# Patient Record
Sex: Male | Born: 1950 | Race: White | Hispanic: No | Marital: Married | Smoking: Former smoker
Health system: Southern US, Community
[De-identification: ages and names within clinical notes are randomized; demographics above are authoritative.]

## PROBLEM LIST (undated history)

## (undated) DIAGNOSIS — C44211 Basal cell carcinoma of skin of unspecified ear and external auricular canal: Secondary | ICD-10-CM

## (undated) DIAGNOSIS — R030 Elevated blood-pressure reading, without diagnosis of hypertension: Secondary | ICD-10-CM

## (undated) DIAGNOSIS — N200 Calculus of kidney: Secondary | ICD-10-CM

## (undated) DIAGNOSIS — E291 Testicular hypofunction: Secondary | ICD-10-CM

## (undated) DIAGNOSIS — H16139 Photokeratitis, unspecified eye: Secondary | ICD-10-CM

## (undated) DIAGNOSIS — D649 Anemia, unspecified: Secondary | ICD-10-CM

## (undated) HISTORY — DX: Basal cell carcinoma of skin of unspecified ear and external auricular canal: C44.211

## (undated) HISTORY — DX: Testicular hypofunction: E29.1

## (undated) HISTORY — DX: Photokeratitis, unspecified eye: H16.139

## (undated) HISTORY — DX: Calculus of kidney: N20.0

## (undated) HISTORY — DX: Elevated blood-pressure reading, without diagnosis of hypertension: R03.0

## (undated) HISTORY — DX: Anemia, unspecified: D64.9

---

## 1995-06-21 HISTORY — PX: WRIST SURGERY: SHX841

## 1995-06-21 HISTORY — PX: SHOULDER SURGERY: SHX246

## 1998-10-18 ENCOUNTER — Encounter: Admission: RE | Admit: 1998-10-18 | Discharge: 1998-10-18 | Payer: Self-pay | Admitting: *Deleted

## 2004-05-25 ENCOUNTER — Emergency Department (HOSPITAL_COMMUNITY): Admission: EM | Admit: 2004-05-25 | Discharge: 2004-05-25 | Payer: Self-pay | Admitting: Emergency Medicine

## 2004-06-20 DIAGNOSIS — N200 Calculus of kidney: Secondary | ICD-10-CM

## 2004-06-20 HISTORY — DX: Calculus of kidney: N20.0

## 2006-05-29 ENCOUNTER — Ambulatory Visit (HOSPITAL_COMMUNITY): Admission: RE | Admit: 2006-05-29 | Discharge: 2006-05-29 | Payer: Self-pay | Admitting: Internal Medicine

## 2012-06-20 HISTORY — PX: MOHS SURGERY: SUR867

## 2012-08-16 ENCOUNTER — Ambulatory Visit (HOSPITAL_COMMUNITY)
Admission: RE | Admit: 2012-08-16 | Discharge: 2012-08-16 | Disposition: A | Discharge status: Home / Self Care | Payer: BC Managed Care – PPO | Source: Ambulatory Visit | Attending: Internal Medicine | Admitting: Internal Medicine

## 2012-08-16 ENCOUNTER — Other Ambulatory Visit (HOSPITAL_COMMUNITY): Payer: Self-pay | Admitting: Internal Medicine

## 2012-08-16 DIAGNOSIS — I10 Essential (primary) hypertension: Secondary | ICD-10-CM | POA: Insufficient documentation

## 2012-08-16 DIAGNOSIS — Z0000 Encounter for general adult medical examination without abnormal findings: Secondary | ICD-10-CM | POA: Insufficient documentation

## 2012-08-16 DIAGNOSIS — F17200 Nicotine dependence, unspecified, uncomplicated: Secondary | ICD-10-CM | POA: Insufficient documentation

## 2012-08-20 ENCOUNTER — Encounter: Payer: Self-pay | Admitting: Internal Medicine

## 2012-09-12 ENCOUNTER — Encounter: Payer: BC Managed Care – PPO | Admitting: Gastroenterology

## 2013-01-01 ENCOUNTER — Encounter: Payer: Self-pay | Admitting: Gastroenterology

## 2013-07-15 ENCOUNTER — Encounter: Payer: Self-pay | Admitting: *Deleted

## 2013-07-17 ENCOUNTER — Encounter: Payer: Self-pay | Admitting: Emergency Medicine

## 2013-07-17 ENCOUNTER — Ambulatory Visit (INDEPENDENT_AMBULATORY_CARE_PROVIDER_SITE_OTHER): Payer: BC Managed Care – PPO | Admitting: Emergency Medicine

## 2013-07-17 VITALS — BP 138/82 | HR 74 | Temp 98.2°F | Resp 16 | Ht 70.5 in | Wt 200.0 lb

## 2013-07-17 DIAGNOSIS — N200 Calculus of kidney: Secondary | ICD-10-CM

## 2013-07-17 DIAGNOSIS — R030 Elevated blood-pressure reading, without diagnosis of hypertension: Secondary | ICD-10-CM

## 2013-07-17 DIAGNOSIS — Z111 Encounter for screening for respiratory tuberculosis: Secondary | ICD-10-CM

## 2013-07-17 DIAGNOSIS — Z0000 Encounter for general adult medical examination without abnormal findings: Secondary | ICD-10-CM

## 2013-07-17 DIAGNOSIS — Z79899 Other long term (current) drug therapy: Secondary | ICD-10-CM

## 2013-07-17 DIAGNOSIS — I10 Essential (primary) hypertension: Secondary | ICD-10-CM

## 2013-07-17 DIAGNOSIS — C44211 Basal cell carcinoma of skin of unspecified ear and external auricular canal: Secondary | ICD-10-CM

## 2013-07-17 DIAGNOSIS — E559 Vitamin D deficiency, unspecified: Secondary | ICD-10-CM

## 2013-07-17 LAB — CBC WITH DIFFERENTIAL/PLATELET
Basophils Absolute: 0.1 10*3/uL (ref 0.0–0.1)
Basophils Relative: 1 % (ref 0–1)
EOS PCT: 2 % (ref 0–5)
Eosinophils Absolute: 0.2 10*3/uL (ref 0.0–0.7)
HCT: 45.1 % (ref 39.0–52.0)
Hemoglobin: 15.8 g/dL (ref 13.0–17.0)
LYMPHS ABS: 3.5 10*3/uL (ref 0.7–4.0)
Lymphocytes Relative: 34 % (ref 12–46)
MCH: 28.7 pg (ref 26.0–34.0)
MCHC: 35 g/dL (ref 30.0–36.0)
MCV: 81.9 fL (ref 78.0–100.0)
Monocytes Absolute: 0.8 10*3/uL (ref 0.1–1.0)
Monocytes Relative: 8 % (ref 3–12)
Neutro Abs: 5.6 10*3/uL (ref 1.7–7.7)
Neutrophils Relative %: 55 % (ref 43–77)
Platelets: 268 10*3/uL (ref 150–400)
RBC: 5.51 MIL/uL (ref 4.22–5.81)
RDW: 13.9 % (ref 11.5–15.5)
WBC: 10.1 10*3/uL (ref 4.0–10.5)

## 2013-07-17 NOTE — Progress Notes (Signed)
   Subjective:    Patient ID: Adam Hammond, male    DOB: 1951/02/07, 63 y.o.   MRN: [id number]  HPI Comments: 64 yo WM CPE. He notes he is doing well overall without any concerns. He has a hx of elevated BP without HTN or RX therapy. BP at home 120s-30s/ 80s. He has d/c pie/cookies/cakes. He eats ice cream on routine basis. He is active with work only. LAST LABS A1C 5.5 T 141 TG 114 H 27 L 91 URIC ACID 5.5  D24 TRACE BLOOD IN urine which  He did not f/u on AD. He denies any urine changes or obvious blood.  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 He denies any SOB or cough.  He keeps his 7m to 1 year [facility] with [place]. He denies any new skin changes.      Review of Systems  Skin:       [place]- due 11/2013  All other systems reviewed and are negative.   BP 138/82  Pulse 74  Temp(Src) 98.2 F (36.8 C) (Temporal)  Resp 16  Ht 5' 10.5" (1.791 m)  Wt 200 lb (90.719 kg)  BMI 28.28 kg/m2     Objective:   Physical Exam  Nursing note and vitals reviewed. Constitutional: He is oriented to person, place, and time. He appears well-developed and well-nourished.  OVERWEIGHT  HENT:  Head: Normocephalic and atraumatic.  Right Ear: External ear normal.  Left Ear: External ear normal.  Nose: Nose normal.  Eyes: Conjunctivae and EOM are normal. Pupils are equal, round, and reactive to light. Right eye exhibits no discharge. Left eye exhibits no discharge. No scleral icterus.  Neck: Normal range of motion. Neck supple. No JVD present. No tracheal deviation present. No thyromegaly present.  Cardiovascular: Normal rate, regular rhythm, normal heart sounds and intact distal pulses.   Pulmonary/Chest: Effort normal and breath sounds normal.  Abdominal: Soft. Bowel sounds are normal. He exhibits no distension and no mass. There is no tenderness. There is no rebound and no guarding.  Genitourinary:  Def GI  Musculoskeletal: Normal range of motion. He  exhibits no edema and no tenderness.  Lymphadenopathy:    He has no cervical adenopathy.  Neurological: He is alert and oriented to person, place, and time. He has normal reflexes. No cranial nerve deficit. He exhibits normal muscle tone. Coordination normal.  Skin: Skin is warm and dry. No rash noted. No erythema. No pallor.     Psychiatric: He has a normal mood and affect. His behavior is normal. Judgment and thought content normal.      EKG NSCSPT WNL IRBBB AORTA SCAN WNL    Assessment & Plan:  1. CPE- Update screening labs/ History/ Immunizations/ Testing as needed. Advised healthy diet, QD exercise, increase H20 and continue RX/ Vitamins AD.PATIENT W/C TO RESCHEDULE OVERDUE COLONOSCOPY. 2. Elevated BP without HTN- Check BP call if >130/80, increase cardio 3. Irreg nevi left shoulder blade- monitor w/c if any change/ recheck at [facility] due 6 month

## 2013-07-17 NOTE — Patient Instructions (Signed)
Hypertension Hypertension is another name for high blood pressure. High blood pressure may mean that your heart needs to work harder to pump blood. Blood pressure consists of two numbers, which includes a higher number over a lower number (example: 110/72). HOME CARE   Make lifestyle changes as told by your doctor. This may include weight loss and exercise.  Take your blood pressure medicine every day.  Limit how much salt you use.  Stop smoking if you smoke.  Do not use drugs.  Talk to your doctor if you are using decongestants or birth control pills. These medicines might make blood pressure higher.  Females should not drink more than 1 alcoholic drink per day. Males should not drink more than 2 alcoholic drinks per day.  See your doctor as told. GET HELP RIGHT AWAY IF:   You have a blood pressure reading with a top number of 180 or higher.  You get a very bad headache.  You get blurred or changing vision.  You feel confused.  You feel weak, numb, or faint.  You get chest or belly (abdominal) pain.  You throw up (vomit).  You cannot breathe very well. MAKE SURE YOU:   Understand these instructions.  Will watch your condition.  Will get help right away if you are not doing well or get worse. Document Released: 11/23/2007 Document Revised: 08/29/2011 Document Reviewed: 11/23/2007 [employer] Patient Information 2014 [employer].  

## 2013-07-18 LAB — BASIC METABOLIC PANEL WITH GFR
BUN: 13 mg/dL (ref 6–23)
CO2: 25 mEq/L (ref 19–32)
Calcium: 9.2 mg/dL (ref 8.4–10.5)
Chloride: 102 mEq/L (ref 96–112)
Creat: 1.12 mg/dL (ref 0.50–1.35)
GFR, Est African American: 82 mL/min
GFR, Est Non African American: 71 mL/min
Glucose, Bld: 85 mg/dL (ref 70–99)
Potassium: 4.2 mEq/L (ref 3.5–5.3)
Sodium: 135 mEq/L (ref 135–145)

## 2013-07-18 LAB — URINALYSIS, ROUTINE W REFLEX MICROSCOPIC
Bilirubin Urine: NEGATIVE
Glucose, UA: NEGATIVE mg/dL
Hgb urine dipstick: NEGATIVE
Ketones, ur: NEGATIVE mg/dL
Leukocytes, UA: NEGATIVE
NITRITE: NEGATIVE
Protein, ur: NEGATIVE mg/dL
Specific Gravity, Urine: 1.019 (ref 1.005–1.030)
Urobilinogen, UA: 0.2 mg/dL (ref 0.0–1.0)
pH: 5.5 (ref 5.0–8.0)

## 2013-07-18 LAB — MICROALBUMIN / CREATININE URINE RATIO
CREATININE, URINE: 196.7 mg/dL
Microalb Creat Ratio: 2.9 mg/g (ref 0.0–30.0)
Microalb, Ur: 0.58 mg/dL (ref 0.00–1.89)

## 2013-07-18 LAB — HEPATIC FUNCTION PANEL
ALT: 28 U/L (ref 0–53)
AST: 22 U/L (ref 0–37)
Albumin: 4.5 g/dL (ref 3.5–5.2)
Alkaline Phosphatase: 76 U/L (ref 39–117)
BILIRUBIN DIRECT: 0.1 mg/dL (ref 0.0–0.3)
Indirect Bilirubin: 0.4 mg/dL (ref 0.2–1.2)
Total Bilirubin: 0.5 mg/dL (ref 0.2–1.2)
Total Protein: 6.7 g/dL (ref 6.0–8.3)

## 2013-07-18 LAB — INSULIN, FASTING: Insulin fasting, serum: 18 u[IU]/mL (ref 3–28)

## 2013-07-18 LAB — LIPID PANEL
Cholesterol: 148 mg/dL (ref 0–200)
HDL: 30 mg/dL — ABNORMAL LOW (ref >39)
LDL CALC: 90 mg/dL (ref 0–99)
Total CHOL/HDL Ratio: 4.9 Ratio
Triglycerides: 140 mg/dL (ref <150)
VLDL: 28 mg/dL (ref 0–40)

## 2013-07-18 LAB — HEMOGLOBIN A1C
Hgb A1c MFr Bld: 5.5 % (ref <5.7)
Mean Plasma Glucose: 111 mg/dL (ref <117)

## 2013-07-18 LAB — TSH: TSH: 0.824 u[IU]/mL (ref 0.350–4.500)

## 2013-07-18 LAB — PSA: PSA: 0.83 ng/mL (ref <=4.00)

## 2013-07-18 LAB — TESTOSTERONE: Testosterone: 196 ng/dL — ABNORMAL LOW (ref 300–890)

## 2013-07-18 LAB — VITAMIN D 25 HYDROXY (VIT D DEFICIENCY, FRACTURES): Vit D, 25-Hydroxy: 28 ng/mL — ABNORMAL LOW (ref 30–89)

## 2013-07-18 LAB — MAGNESIUM: Magnesium: 2.1 mg/dL (ref 1.5–2.5)

## 2013-07-18 LAB — VITAMIN B12: Vitamin B-12: 292 pg/mL (ref 211–911)

## 2013-07-22 ENCOUNTER — Encounter: Payer: Self-pay | Admitting: Emergency Medicine

## 2013-07-22 DIAGNOSIS — C44211 Basal cell carcinoma of skin of unspecified ear and external auricular canal: Secondary | ICD-10-CM | POA: Insufficient documentation

## 2013-07-22 DIAGNOSIS — R030 Elevated blood-pressure reading, without diagnosis of hypertension: Secondary | ICD-10-CM | POA: Insufficient documentation

## 2013-07-22 DIAGNOSIS — N200 Calculus of kidney: Secondary | ICD-10-CM | POA: Insufficient documentation

## 2013-07-22 LAB — TB SKIN TEST
Induration: 0 mm
TB Skin Test: NEGATIVE

## 2013-07-24 NOTE — Progress Notes (Signed)
[facility] TO CALL TO SCHEDULE A 6 WK APPT

## 2013-09-05 ENCOUNTER — Encounter: Payer: Self-pay | Admitting: Emergency Medicine

## 2013-09-05 ENCOUNTER — Ambulatory Visit (INDEPENDENT_AMBULATORY_CARE_PROVIDER_SITE_OTHER): Payer: BC Managed Care – PPO | Admitting: Emergency Medicine

## 2013-09-05 VITALS — BP 122/68 | HR 80 | Temp 98.0°F | Resp 18 | Ht 71.5 in | Wt 195.0 lb

## 2013-09-05 DIAGNOSIS — E559 Vitamin D deficiency, unspecified: Secondary | ICD-10-CM

## 2013-09-05 DIAGNOSIS — E291 Testicular hypofunction: Secondary | ICD-10-CM

## 2013-09-05 DIAGNOSIS — E538 Deficiency of other specified B group vitamins: Secondary | ICD-10-CM

## 2013-09-05 DIAGNOSIS — I10 Essential (primary) hypertension: Secondary | ICD-10-CM

## 2013-09-05 LAB — CBC WITH DIFFERENTIAL/PLATELET
Basophils Absolute: 0.1 10*3/uL (ref 0.0–0.1)
Basophils Relative: 1 % (ref 0–1)
Eosinophils Absolute: 0.4 10*3/uL (ref 0.0–0.7)
Eosinophils Relative: 4 % (ref 0–5)
HCT: 45.1 % (ref 39.0–52.0)
Hemoglobin: 16 g/dL (ref 13.0–17.0)
Lymphocytes Relative: 33 % (ref 12–46)
Lymphs Abs: 3 10*3/uL (ref 0.7–4.0)
MCH: 28.6 pg (ref 26.0–34.0)
MCHC: 35.5 g/dL (ref 30.0–36.0)
MCV: 80.7 fL (ref 78.0–100.0)
Monocytes Absolute: 1 10*3/uL (ref 0.1–1.0)
Monocytes Relative: 11 % (ref 3–12)
Neutro Abs: 4.6 10*3/uL (ref 1.7–7.7)
Neutrophils Relative %: 51 % (ref 43–77)
Platelets: 280 10*3/uL (ref 150–400)
RBC: 5.59 MIL/uL (ref 4.22–5.81)
RDW: 13.7 % (ref 11.5–15.5)
WBC: 9.1 10*3/uL (ref 4.0–10.5)

## 2013-09-05 NOTE — Patient Instructions (Signed)
Vitamin B12 Deficiency ADD SUPER B COMPLEX Not having enough vitamin B12 is called a deficiency. Your body needs this vitamin for important body functions. HOME CARE  Take all vitamins, herbs, or nutrition drinks (supplements) as told by your doctor.  Get shots (injections) as told. Do not miss your doctor visit.  Eat foods than contain vitamin B12. This includes:  Meat.  Chicken, [place], or other birds (poultry).  Fish.  Eggs.  Cereals or milk with added vitamin B12. Check the label.  Do not drink too much (abuse) alcohol.  Keep all doctor visits as told. GET HELP IF:  You have questions.  Your problems come back. MAKE SURE YOU:  Understand these instructions.  Will watch your condition.  Will get help right away if you are not doing well or get worse. Document Released: 05/26/2011 Document Revised: 08/29/2011 Document Reviewed: 05/26/2011 [facility] Patient Information 2014 [place], [place].

## 2013-09-05 NOTE — Progress Notes (Signed)
   Subjective:    Patient ID: Adam Hammond, male    DOB: 03/18/1951, 63 y.o.   MRN: [id number]  HPI Comments: 63 yo male for anemia/ Vit d. Def, hypogonadism f/u. He added almonds and Vit D at last OV. He did not add B12 AD.  He did not start testosterone. He prefers to stay off all Rx if possible. He is feeling well overall.     Medication List    Notice As of 09/05/2013 11:59 PM   You have not been prescribed any medications.     No Known Allergies Past Medical History  Diagnosis Date  . Nephrolithiasis 2006  . Actinic keratitis   . BCC (basal cell carcinoma), ear     left ear  . Elevated blood pressure reading without diagnosis of hypertension   Anemia Hypogonadism    Review of Systems  All other systems reviewed and are negative.   BP 122/68  Pulse 80  Temp(Src) 98 F (36.7 C) (Temporal)  Resp 18  Ht 5' 11.5" (1.816 m)  Wt 195 lb (88.451 kg)  BMI 26.82 kg/m2     Objective:   Physical Exam  Nursing note and vitals reviewed. Constitutional: He is oriented to person, place, and time. He appears well-developed and well-nourished.  HENT:  Head: Normocephalic and atraumatic.  Right Ear: External ear normal.  Left Ear: External ear normal.  Nose: Nose normal.  Eyes: Conjunctivae and EOM are normal.  Neck: Normal range of motion. Neck supple. No JVD present. No thyromegaly present.  Cardiovascular: Normal rate, regular rhythm, normal heart sounds and intact distal pulses.   Pulmonary/Chest: Effort normal and breath sounds normal.  Abdominal: Soft. Bowel sounds are normal. He exhibits no distension and no mass. There is no tenderness. There is no rebound and no guarding.  Musculoskeletal: Normal range of motion. He exhibits no edema and no tenderness.  Lymphadenopathy:    He has no cervical adenopathy.  Neurological: He is alert and oriented to person, place, and time. He has normal reflexes. No cranial nerve deficit. Coordination normal.  Skin: Skin is warm and  dry.  Psychiatric: He has a normal mood and affect. His behavior is normal. Judgment and thought content normal.          Assessment & Plan:  1. Vit D/ Anemia- Recheck labs, take Vitamins AD increase green leafy veggies, increase H2O, w/c if any symptom increase. 2. Hypogonadism- Prefers to stay of ALL RX. Recheck labs

## 2013-09-06 LAB — FOLATE RBC: RBC Folate: 549 ng/mL (ref >280)

## 2013-09-06 LAB — TESTOSTERONE: Testosterone: 183 ng/dL — ABNORMAL LOW (ref 300–890)

## 2013-09-06 LAB — VITAMIN B12: Vitamin B-12: 245 pg/mL (ref 211–911)

## 2013-09-06 LAB — VITAMIN D 25 HYDROXY (VIT D DEFICIENCY, FRACTURES): Vit D, 25-Hydroxy: 49 ng/mL (ref 30–89)

## 2013-09-08 ENCOUNTER — Encounter: Payer: Self-pay | Admitting: Emergency Medicine

## 2013-09-08 DIAGNOSIS — E291 Testicular hypofunction: Secondary | ICD-10-CM | POA: Insufficient documentation

## 2013-09-12 ENCOUNTER — Encounter: Payer: Self-pay | Admitting: Physician Assistant

## 2013-09-12 ENCOUNTER — Ambulatory Visit (INDEPENDENT_AMBULATORY_CARE_PROVIDER_SITE_OTHER): Payer: BC Managed Care – PPO | Admitting: Physician Assistant

## 2013-09-12 VITALS — BP 112/70 | HR 64 | Temp 98.2°F | Resp 16 | Ht 71.5 in | Wt 198.0 lb

## 2013-09-12 DIAGNOSIS — S838X9A Sprain of other specified parts of unspecified knee, initial encounter: Secondary | ICD-10-CM

## 2013-09-12 DIAGNOSIS — S86112A Strain of other muscle(s) and tendon(s) of posterior muscle group at lower leg level, left leg, initial encounter: Secondary | ICD-10-CM

## 2013-09-12 DIAGNOSIS — S86819A Strain of other muscle(s) and tendon(s) at lower leg level, unspecified leg, initial encounter: Secondary | ICD-10-CM

## 2013-09-12 NOTE — Patient Instructions (Signed)
Medial Head Gastrocnemius Tear (Tennis Leg)  with Rehab Medial head gastrocnemius tear, also called tennis leg, is a tear (strain) in a muscle or tendon of the inner portion (medial head) of one of the calf muscles (gastrocnemius). The inner portion of the calf muscle attaches to the thigh bone (femur) and is responsible for bending the knee and straightening the foot (standing on "tippy toes"). Strains are classified into three categories. Grade 1 strains cause pain, but the tendon is not lengthened. Grade 2 strains include a lengthened ligament, due to the ligament being stretched or partially ruptured. With grade 2 strains there is still function, although function may be decreased. Grade 3 strains involve a complete tear of the tendon or muscle, and function is usually impaired. SYMPTOMS   Sudden "pop" or tear felt at the time of injury.  Pain, tenderness, swelling, warmth, or redness over the middle inner calf.  Pain and weakness with ankle motion, especially flexing the ankle against resistance, as well as pain with lifting the foot up (extending the ankle).  Bruising (contusion) of the calf, heel and, sometimes, foot within 48 hours of injury.  Muscle spasm in the calf. CAUSES  Muscle and ligament strains occur when a force is placed on the muscle or ligament that is greater than it can handle. Common causes of injury include:  Direct hit (trauma) to the calf.  Sudden forceful pushing off or landing on the foot (jumping, landing, serving a tennis ball, lunging). RISK INCREASES WITH:  Sports that require sudden, explosive calf muscle contraction, such as those involving jumping (basketball), hill running, quick starts (running), or lunging (racquetball, tennis).  Contact sports (football, soccer, hockey).  Poor strength and flexibility.  Previous lower limb injury. PREVENTION  Warm up and stretch properly before activity.  Allow for adequate recovery between  workouts.  Maintain physical fitness:  Strength, flexibility, and endurance.  Cardiovascular fitness.  Learn and use proper exercise technique.  Complete rehabilitation after lower limb injury, before returning to competition or practice. PROGNOSIS  If treated properly, tennis leg usually heals within 6 weeks of non-surgical treatment.  RELATED COMPLICATIONS   Longer healing time, if not properly treated or if not given enough time to heal.  Recurring symptoms and injury, if activity is resumed too soon, with overuse, with a direct blow, or with poor technique.  If untreated, may progress to a complete tear (rare) or other injury, due to limping and favoring of the injured leg.  Persistent limping, due to scarring and shortening of the calf muscles, as a result of inadequate rehabilitation.  Prolonged disability. TREATMENT  Treatment first involves the use of ice and medication to help reduce pain and inflammation. The use of strengthening and stretching exercises may help reduce pain with activity. These exercises may be performed at home or with a therapist. For severe injuries, referral to a therapist may be needed for further evaluation and treatment. Your caregiver may advise that you wear a brace to help healing. Sometimes, crutches are needed until you can walk without limping. Rarely, surgery is needed.  MEDICATION   If pain medicine is needed, nonsteroidal anti-inflammatory medicines (aspirin and ibuprofen), or other minor pain relievers (acetaminophen), are often advised.  Do not take pain medicine for 7 days before surgery.  Prescription pain relievers may be given, if your caregiver thinks they are needed. Use only as directed and only as much as you need. HEAT AND COLD  Cold treatment (icing) should be applied for 10 to  15 minutes every 2 to 3 hours for inflammation and pain, and immediately after activity that aggravates your symptoms. Use ice packs or an ice  massage.  Heat treatment may be used before performing stretching and strengthening activities prescribed by your caregiver, physical therapist, or athletic trainer. Use a heat pack or a warm water soak. SEEK MEDICAL CARE IF:   Symptoms get worse or do not improve in 2 weeks, despite treatment.  Numbness or tingling develops.  New, unexplained symptoms develop. (Drugs used in treatment may produce side effects.) EXERCISES  RANGE OF MOTION (ROM) AND STRETCHING EXERCISES - Medial Head Gastrocnemius Tear (Tennis Leg) These exercises may help you when beginning to rehabilitate your injury. Your symptoms may resolve with or without further involvement from your physician, physical therapist or athletic trainer. While completing these exercises, remember:   Restoring tissue flexibility helps normal motion to return to the joints. This allows healthier, less painful movement and activity.  An effective stretch should be held for at least 30 seconds.  A stretch should never be painful. You should only feel a gentle lengthening or release in the stretched tissue. STRETCH - Gastrocsoleus  Sit with your right / left leg extended. Holding onto both ends of a belt or towel, loop it around the ball of your foot.  Keeping your right / left ankle and foot relaxed and your knee straight, pull your foot and ankle toward you using the belt.  You should feel a gentle stretch behind your calf or knee. Hold this position for __________ seconds. Repeat __________ times. Complete this stretch __________ times per day.  RANGE OF MOTION - Ankle Dorsiflexion, Active Assisted   Remove your shoes and sit on a chair, preferably not on a carpeted surface.  Place your right / left foot directly under the knee. Extend your opposite leg for support.  Keeping your heel down, slide your right / left foot back toward the chair, until you feel a stretch at your ankle or calf. If you do not feel a stretch, slide your  bottom forward to the edge of the chair, while still keeping your heel down.  Hold this stretch for __________ seconds. Repeat __________ times. Complete this stretch __________ times per day.  STRETCH  Gastroc, Standing   Place your hands on a wall.  Extend your right / left leg behind you, keeping the front knee somewhat bent.  Slightly point your toes inward on your back foot.  Keeping your right / left heel on the floor and your knee straight, shift your weight toward the wall, not allowing your back to arch.  You should feel a gentle stretch in the right / left calf. Hold this position for __________ seconds. Repeat __________ times. Complete this stretch __________ times per day. STRETCH  Soleus, Standing   Place your hands on a wall.  Extend your right / left leg behind you, keeping the other knee somewhat bent.  Slightly point your toes inward on your back foot.  Keep your right / left heel on the floor, bend your back knee, and slightly shift your weight over the back leg so that you feel a gentle stretch deep in your back calf.  Hold this position for __________ seconds. Repeat __________ times. Complete this stretch __________ times per day. STRETCH  Gastrocsoleus, Standing Note: This exercise can place a lot of stress on your foot and ankle. Please complete this exercise only if specifically instructed by your caregiver.   Place the ball  of your right / left foot on a step, keeping your other foot firmly on the same step.  Hold on to the wall or a rail for balance.  Slowly lift your other foot, allowing your body weight to press your heel down over the edge of the step.  You should feel a stretch in your right / left calf.  Hold this position for __________ seconds.  Repeat this exercise with a slight bend in your right / left knee. Repeat __________ times. Complete this stretch __________ times per day.  STRENGTHENING EXERCISES - Medial Head Gastrocnemius Tear  (Tennis Leg) These exercises may help you when beginning to rehabilitate your injury. They may resolve your symptoms with or without further involvement from your physician, physical therapist or athletic trainer. While completing these exercises, remember:   Muscles can gain both the endurance and the strength needed for everyday activities through controlled exercises.  Complete these exercises as instructed by your physician, physical therapist or athletic trainer. Increase the resistance and repetitions only as guided by your caregiver. STRENGTH - Plantar-flexors  Sit with your right / left leg extended. Holding onto both ends of a rubber exercise band or tubing, loop it around the ball of your foot. Keep a slight tension in the band.  Slowly push your toes away from you, pointing them downward.  Hold this position for __________ seconds. Return slowly, controlling the tension in the band. Repeat __________ times. Complete this exercise __________ times per day.  STRENGTH - Plantar-flexors  Stand with your feet shoulder width apart. Steady yourself with a wall or table, using as little support as needed.  Keeping your weight evenly spread over the width of your feet, rise up on your toes.*  Hold this position for __________ seconds. Repeat __________ times. Complete this exercise __________ times per day.  *If this is too easy, shift your weight toward your right / left leg until you feel challenged. Ultimately, you may be asked to do this exercise while standing on your right / left foot only. STRENGTH  Plantar-flexors, Eccentric Note: This exercise can place a lot of stress on your foot and ankle. Please complete this exercise only if specifically instructed by your caregiver.   Place the balls of your feet on a step. With your hands, use only enough support from a wall or rail to keep your balance.  Keep your knees straight and rise up on your toes.  Slowly shift your weight  entirely to your right / left toes and pick up your opposite foot. Gently and with controlled movement, lower your weight through your right / left foot so that your heel drops below the level of the step. You will feel a slight stretch in the back of your right / left calf.  Use the healthy leg to help rise up onto the balls of both feet, then lower weight only onto the right / left leg again. Build up to 15 repetitions. Then progress to 3 sets of 15 repetitions.*  After completing the above exercise, complete the same exercise with a slight knee bend (about 30 degrees). Again, build up to 15 repetitions. Then progress to 3 sets of 15 repetitions.* Perform this exercise __________ times per day.  *When you easily complete 3 sets of 15, your physician, physical therapist or athletic trainer may advise you to add resistance, by wearing a backpack filled with additional weight. Document Released: 06/06/2005 Document Revised: 08/29/2011 Document Reviewed: 09/18/2008 [facility] Patient Information 2014 [place], [place].

## 2013-09-12 NOTE — Progress Notes (Signed)
   Subjective:    Patient ID: Adam Hammond, male    DOB: 1950/12/11, 63 y.o.   MRN: [id number]  HPI 63 y.o. male presents for left leg pain. He states that yesterday afternoon he took off running and felt severe pain and possibly heard a "pop" in his left calf. He thought something had hit him but nothing did. Denies swelling, bruising, redness, warmth. Now with walking, and moving in general can cause medial left calf pain- cramping pain. Feels stiff, no elasticity to it, can bear weight on it.    Review of Systems  HENT: Negative.   Respiratory: Negative.   Cardiovascular: Negative.   Gastrointestinal: Negative.   Genitourinary: Negative.   Musculoskeletal: Positive for gait problem and myalgias. Negative for arthralgias, back pain, joint swelling, neck pain and neck stiffness.  Skin: Negative.  Negative for rash and wound.  Neurological: Negative.        Objective:   Physical Exam  Constitutional: He appears well-developed and well-nourished.  HENT:  Head: Normocephalic and atraumatic.  Eyes: Conjunctivae are normal. Pupils are equal, round, and reactive to light.  Neck: Normal range of motion. Neck supple.  Cardiovascular: Normal rate and regular rhythm.   Pulmonary/Chest: Effort normal and breath sounds normal.  Abdominal: Soft. Bowel sounds are normal.  Musculoskeletal:  Left foot with good cap refill, 2/2 distal pulses, no edema. Left ankle full ROM with 5/5 strength with dorsal and plantar flexion. Full ROM left knee without erythema, swelling, or redness at the joint. Left calf with swelling and pain to palpation medial gastrocnemius without erythema, or distal edema.Unable to do single calf lift on left side.   Skin: Skin is warm and dry.      Assessment & Plan:  Severe gastrocnemius strain/tear-  Suggest crutches for 1 week Then compression stockings, ice/heat, RICE If any severe pain- go to ER to rule out compartment syndrome

## 2013-09-19 ENCOUNTER — Encounter: Payer: Self-pay | Admitting: Physician Assistant

## 2013-09-19 ENCOUNTER — Ambulatory Visit (INDEPENDENT_AMBULATORY_CARE_PROVIDER_SITE_OTHER): Payer: BC Managed Care – PPO | Admitting: Physician Assistant

## 2013-09-19 VITALS — BP 122/78 | HR 72 | Temp 98.1°F | Resp 16 | Ht 71.5 in | Wt 200.0 lb

## 2013-09-19 DIAGNOSIS — S86819A Strain of other muscle(s) and tendon(s) at lower leg level, unspecified leg, initial encounter: Secondary | ICD-10-CM

## 2013-09-19 DIAGNOSIS — S86112A Strain of other muscle(s) and tendon(s) of posterior muscle group at lower leg level, left leg, initial encounter: Secondary | ICD-10-CM

## 2013-09-19 DIAGNOSIS — S838X9A Sprain of other specified parts of unspecified knee, initial encounter: Secondary | ICD-10-CM

## 2013-09-19 NOTE — Progress Notes (Signed)
   Subjective:    Patient ID: Adam Hammond, male    DOB: [birth year], 63 y.o.   MRN: [id number]  HPI Patient presents for follow up Severe gastrocnemius strain/tear that he sustained 1 week ago. At that time he was unable to put weight on his left leg and was unable to do a single calf lift and was in significant pain. He states that he used the crutches for a day but after that has been able to walk without them. He continues to need to rest and states that if he carries anything heavy he experiences pain.    Review of Systems  HENT: Negative.   Respiratory: Negative.   Cardiovascular: Negative.   Gastrointestinal: Negative.   Genitourinary: Negative.   Musculoskeletal: Positive for gait problem and myalgias. Negative for arthralgias, back pain, joint swelling, neck pain and neck stiffness.  Skin: Negative.  Negative for rash and wound.  Neurological: Negative.        Objective:   Physical Exam  Constitutional: He appears well-developed and well-nourished.  HENT:  Head: Normocephalic and atraumatic.  Eyes: Conjunctivae are normal. Pupils are equal, round, and reactive to light.  Neck: Normal range of motion. Neck supple.  Cardiovascular: Normal rate and regular rhythm.   Pulmonary/Chest: Effort normal and breath sounds normal.  Abdominal: Soft. Bowel sounds are normal.  Musculoskeletal:  Left foot with good cap refill, 2/2 distal pulses, no edema. Left ankle full ROM with 5/5 strength with dorsal and plantar flexion. Full ROM left knee without erythema, swelling, or redness at the joint. Left calf with pain to palpation medial gastrocnemius without erythema, or distal edema.Unable to do single calf lift on left side but currently able to walk with crutches.   Skin: Skin is warm and dry.       Assessment & Plan:  Severe gastrocnemius strain/tear, left He has improved from his previous visit, he is able to put weight on it but still has pain with plantar flexion.  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  He can go back to work without restrictions on 09/30/2013 and will call if that changes.   Continue RICE at home and NSAIDS

## 2013-09-23 IMAGING — CR DG CHEST 2V
2 series · 2 of 2 positions shown · non-contrast
Comparison: 05/29/2006

CLINICAL DATA: Annual physical evaluation.  Smoker with
hypertension

CHEST - 2 VIEW

[view not recorded (1 of 2)]
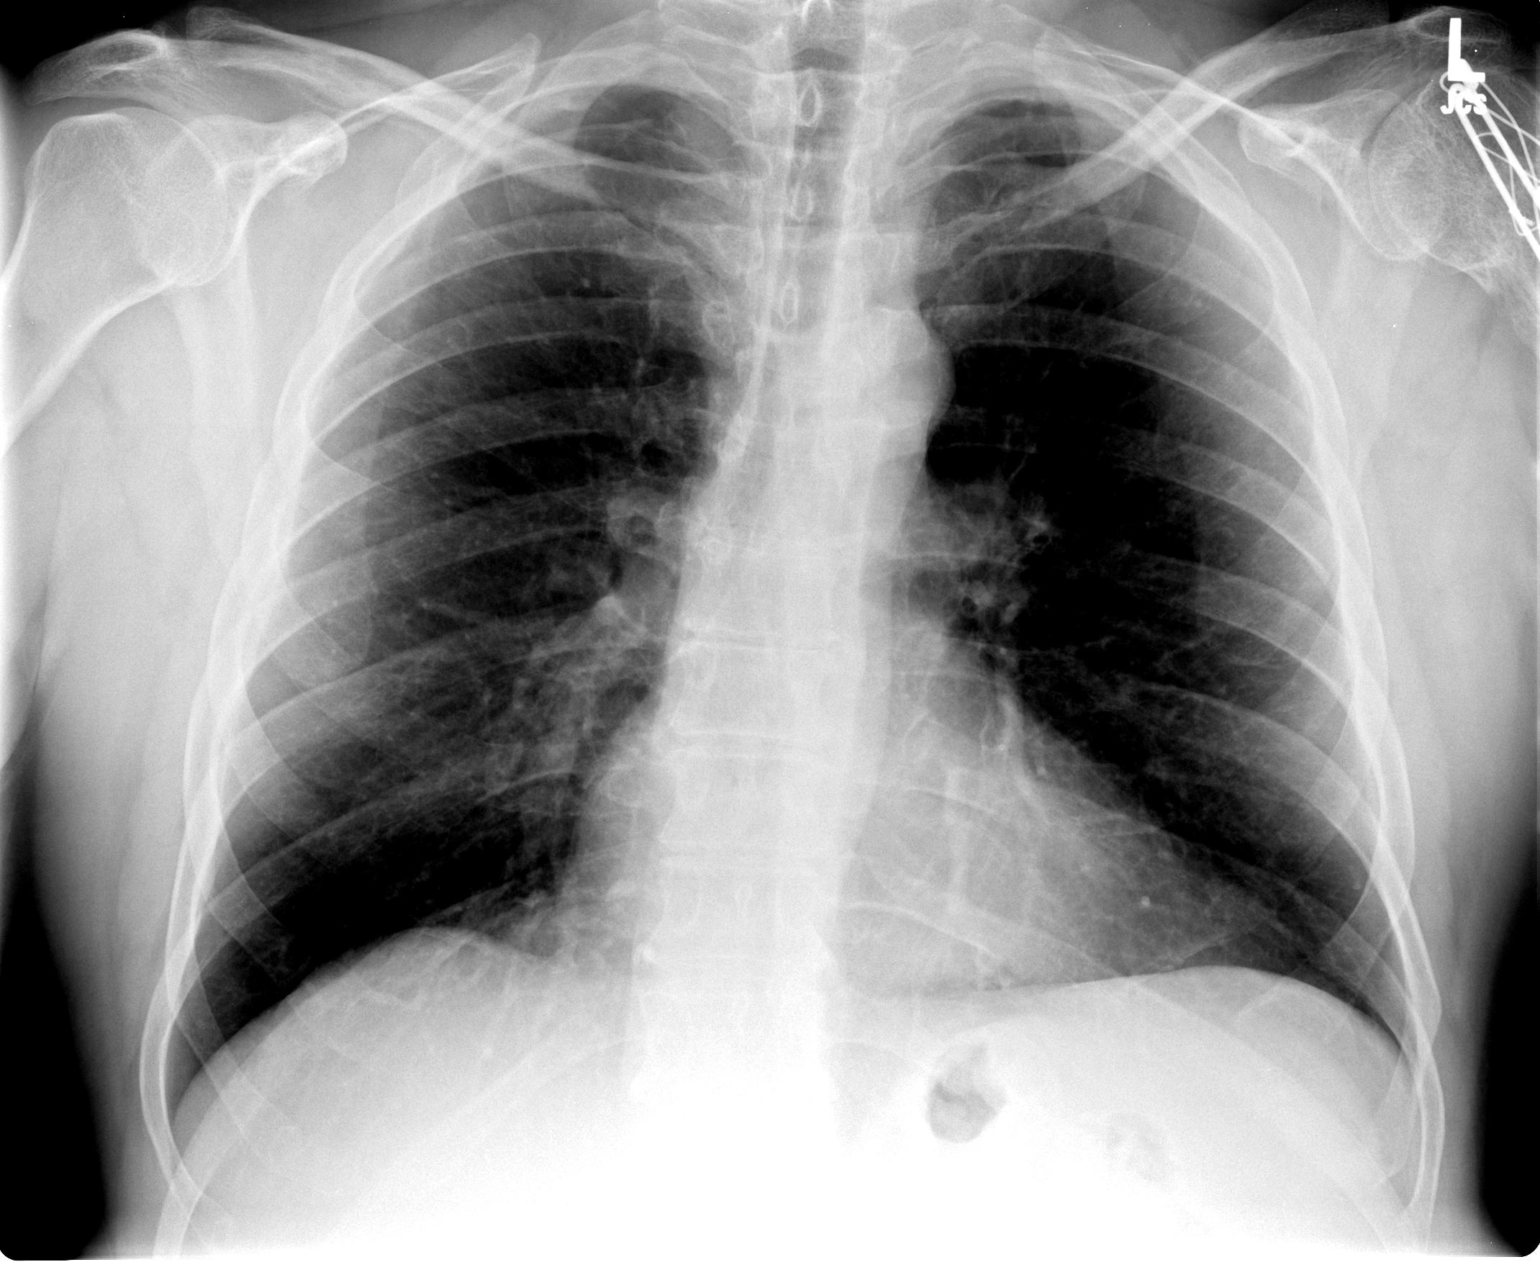

[view not recorded (2 of 2)]
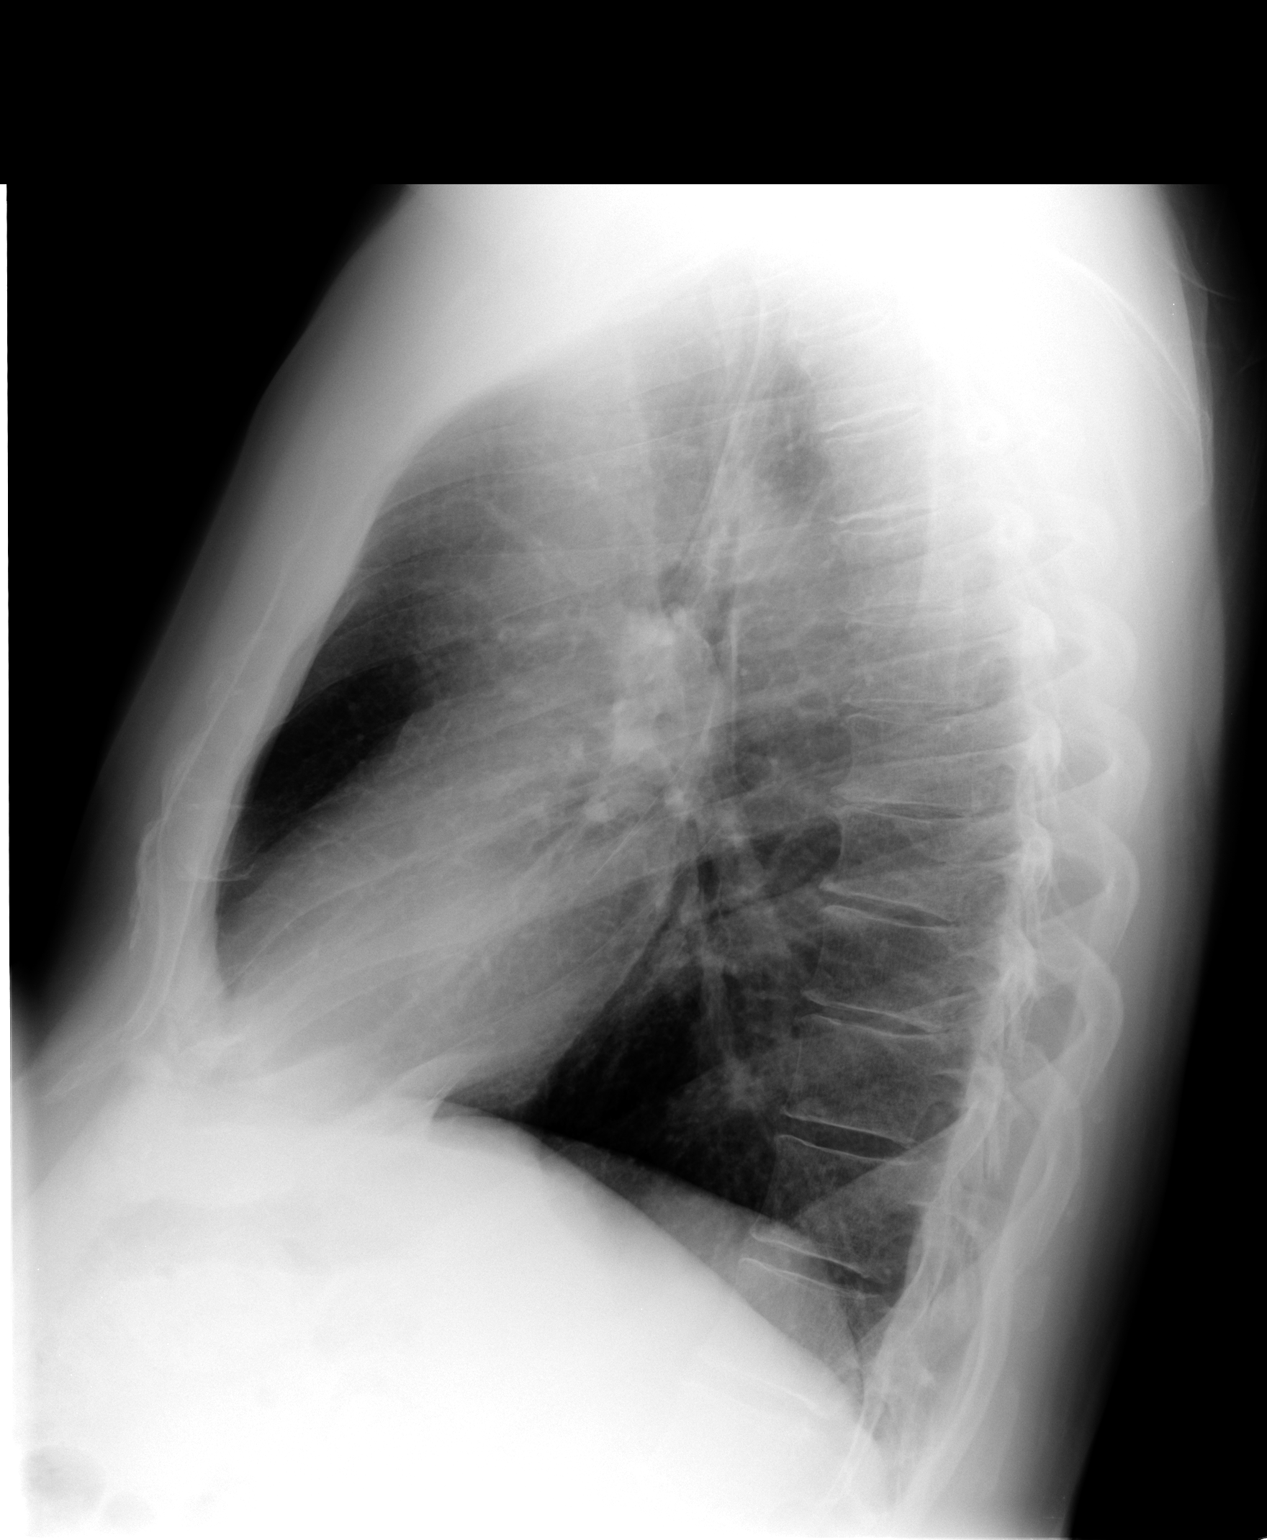

[2 of 2 positions shown; findings below may reference images not displayed]

FINDINGS: Heart and mediastinal contours are within normal limits.
The lung fields are clear with no signs of focal infiltrate or
congestive failure.  No pleural fluid or significant peribronchial
cuffing is seen.  Mild bilateral apical pleural thickening is
stable.

Bony structures are notable for prior fixation of a left humeral
head fracture and are otherwise intact.
IMPRESSION: Stable cardiopulmonary appearance with no new focal or acute
abnormality seen.

## 2014-07-21 ENCOUNTER — Encounter: Payer: Self-pay | Admitting: Emergency Medicine

## 2014-08-19 ENCOUNTER — Encounter: Payer: Self-pay | Admitting: Emergency Medicine

## 2014-08-19 ENCOUNTER — Ambulatory Visit (INDEPENDENT_AMBULATORY_CARE_PROVIDER_SITE_OTHER): Payer: BLUE CROSS/BLUE SHIELD | Admitting: Emergency Medicine

## 2014-08-19 VITALS — BP 136/80 | HR 70 | Temp 98.0°F | Resp 16 | Ht 71.0 in | Wt 191.0 lb

## 2014-08-19 DIAGNOSIS — R239 Unspecified skin changes: Secondary | ICD-10-CM

## 2014-08-19 DIAGNOSIS — Z79899 Other long term (current) drug therapy: Secondary | ICD-10-CM

## 2014-08-19 DIAGNOSIS — Z0001 Encounter for general adult medical examination with abnormal findings: Secondary | ICD-10-CM

## 2014-08-19 DIAGNOSIS — R6889 Other general symptoms and signs: Secondary | ICD-10-CM

## 2014-08-19 DIAGNOSIS — E559 Vitamin D deficiency, unspecified: Secondary | ICD-10-CM

## 2014-08-19 DIAGNOSIS — Z125 Encounter for screening for malignant neoplasm of prostate: Secondary | ICD-10-CM

## 2014-08-19 DIAGNOSIS — Z0000 Encounter for general adult medical examination without abnormal findings: Secondary | ICD-10-CM

## 2014-08-19 DIAGNOSIS — Z23 Encounter for immunization: Secondary | ICD-10-CM

## 2014-08-19 DIAGNOSIS — R030 Elevated blood-pressure reading, without diagnosis of hypertension: Secondary | ICD-10-CM

## 2014-08-19 DIAGNOSIS — R5381 Other malaise: Secondary | ICD-10-CM

## 2014-08-19 DIAGNOSIS — R5383 Other fatigue: Secondary | ICD-10-CM

## 2014-08-19 LAB — CBC WITH DIFFERENTIAL/PLATELET
Basophils Absolute: 0 10*3/uL (ref 0.0–0.1)
Basophils Relative: 0 % (ref 0–1)
EOS ABS: 0.2 10*3/uL (ref 0.0–0.7)
Eosinophils Relative: 3 % (ref 0–5)
HEMATOCRIT: 47 % (ref 39.0–52.0)
Hemoglobin: 16.7 g/dL (ref 13.0–17.0)
LYMPHS ABS: 2.6 10*3/uL (ref 0.7–4.0)
Lymphocytes Relative: 32 % (ref 12–46)
MCH: 29 pg (ref 26.0–34.0)
MCHC: 35.5 g/dL (ref 30.0–36.0)
MCV: 81.7 fL (ref 78.0–100.0)
MPV: 9.3 fL (ref 8.6–12.4)
Monocytes Absolute: 0.7 10*3/uL (ref 0.1–1.0)
Monocytes Relative: 9 % (ref 3–12)
Neutro Abs: 4.6 10*3/uL (ref 1.7–7.7)
Neutrophils Relative %: 56 % (ref 43–77)
Platelets: 228 10*3/uL (ref 150–400)
RBC: 5.75 MIL/uL (ref 4.22–5.81)
RDW: 13 % (ref 11.5–15.5)
WBC: 8.2 10*3/uL (ref 4.0–10.5)

## 2014-08-19 LAB — HEMOGLOBIN A1C
Hgb A1c MFr Bld: 5.6 % (ref <5.7)
Mean Plasma Glucose: 114 mg/dL (ref <117)

## 2014-08-19 NOTE — Patient Instructions (Signed)

## 2014-08-19 NOTE — Progress Notes (Signed)
Subjective:    Patient ID: Adam Hammond, male    DOB: February 07, 1951, 64 y.o.   MRN: [id number]  HPI Comments: 64 yo WM CPE. He notes BP good at home and does not have history of any HTN prescription in past. He manages elevated BP with diet/ exercise/ He has decreased sugar intake and is trying to lose weight. He has been increasing water intake.  He notes mild decrease in energy. Declines testosterone prescription with past abnormal low lab. He also has a history of mild anemia in the past but has not been on any supplements. He is trying to wean off Nicotine vapors and may need prescription for patches in the future. He occasionally has dry cough.  [facility] with recent WNL exam.    He currently declines overdue colonoscopy despite risk. He is aware of risks. He denies bowel changes or concerns.   He has not had any kidney stones in last year with increased water intake.   Lab Results      Component                Value               Date                      WBC                      9.1                 09/05/2013                HGB                      16.0                09/05/2013                HCT                      45.1                09/05/2013                PLT                      280                 09/05/2013                GLUCOSE                  85                  07/17/2013                CHOL                     148                 07/17/2013                TRIG                     140                 07/17/2013  HDL                      30*                 07/17/2013                LDLCALC                  90                  07/17/2013                ALT                      28                  07/17/2013                AST                      22                  07/17/2013                NA                       135                 07/17/2013                K                        4.2                 07/17/2013                 CL                       102                 07/17/2013                CREATININE               1.12                07/17/2013                BUN                      13                  07/17/2013                CO2                      25                  07/17/2013                TSH                      0.824               07/17/2013  PSA                      0.83                07/17/2013                HGBA1C                   5.5                 07/17/2013                MICROALBUR               0.58                07/17/2013            Testosterone 183     Medication List    Notice  As of 08/19/2014 11:59 PM   You have not been prescribed any medications.     No Known Allergies   Past Medical History  Diagnosis Date  . Nephrolithiasis 2006  . Actinic keratitis   . BCC (basal cell carcinoma), ear     left ear  . Elevated blood pressure reading without diagnosis of hypertension   . Anemia   . Hypogonadism male    Past Surgical History  Procedure Laterality Date  . Shoulder surgery Left 1997  . Wrist surgery Left 1997  . Mohs surgery Left 2014    ear BCC   History  Substance Use Topics  . Smoking status: Former Smoker    Quit date: 05/14/2012  . Smokeless tobacco: Not on file     Comment: uses E cig daily  . Alcohol Use: Not on file   Family History  Problem Relation Age of Onset  . Hypertension Mother   . Cancer Mother     metastatic melanoma  . Hyperlipidemia Mother   . Heart attack Father   . Alcohol abuse Father   . Heart disease Father   . Hyperlipidemia Father   . Cancer Father     throat  . Diabetes Sister     juvenile  . Heart attack Sister   . Heart disease Sister   . Hyperlipidemia Sister   . Cancer Other     lymphoma  . Heart disease Sister   . Hyperlipidemia Sister       MAINTENANCE: Colonoscopy: Declines currently MYT:[id number] Dentist: Q 6-12 m DERM: 2016 WNL OLI:[id number]  IMMUNIZATIONS: Tdap:  08/20/2014 Pneumovax:declines Zostavax:n/a Influenza:Declines  Patient Care Team: Unk Pinto, MD as PCP - General (Internal Medicine) Jannette Spanner, MD as Referring Physician (Dermatology)  Review of Systems  Constitutional: Positive for fatigue.  Respiratory: Positive for cough. Negative for shortness of breath.   Cardiovascular: Negative for chest pain.  Gastrointestinal: Negative for abdominal distention and anal bleeding.  All other systems reviewed and are negative.  BP 136/80 mmHg  Pulse 70  Temp(Src) 98 F (36.7 C) (Temporal)  Resp 16  Ht 5\' 11"  (1.803 m)  Wt 191 lb (86.637 kg)  BMI 26.65 kg/m2      Objective:   Physical Exam  Constitutional: He is oriented to person, place, and time. He appears well-developed and well-nourished.  HENT:  Head: Normocephalic.  Nose: Nose normal.  Mouth/Throat: Oropharynx is clear and moist.  Eyes: Conjunctivae and EOM are normal. Pupils are equal, round, and reactive to light. No scleral icterus.  Neck: Normal range  of motion. Neck supple. No JVD present. No tracheal deviation present. No thyromegaly present.  Cardiovascular: Normal rate, regular rhythm, normal heart sounds and intact distal pulses.   Pulmonary/Chest: Effort normal and breath sounds normal.  Abdominal: Soft. Bowel sounds are normal. He exhibits no distension and no mass. There is no tenderness.  Musculoskeletal: Normal range of motion. He exhibits no edema or tenderness.  Lymphadenopathy:    He has no cervical adenopathy.  Neurological: He is alert and oriented to person, place, and time. He has normal reflexes. No cranial nerve deficit. He exhibits normal muscle tone. Coordination normal.  Skin: Skin is warm and dry. No rash noted. No erythema.  Thick great TOE nails with yellowing. Mid Back flat dark 2 mm irregular NEVI  Psychiatric: He has a normal mood and affect. His behavior is normal. Judgment and thought content normal.  Nursing note and vitals  reviewed.     AORTA SCAN WNL EKG NSCSPT     Assessment & Plan:  1. CPE- Update screening labs/ History/ Immunizations/ Testing as needed. Advised healthy diet, QD exercise, increase H20 and continue RX/ Vitamins AD. He currently REFUSES colonoscopy but will call if he changes his mind to have it scheduled.   2. Fatigue with Low testosterone hx- check labs, increase activity and H2O, he prefers no RX Testosterone  3. Elevated BP w/o DX of HTN- Check BP call if >130/80, increase cardio   4. Nicotine/ Tobacco Dep- advised cessation techniques and need for d/c to decrease Risk. Advised he will call if wants prescription for Nicotrol patch to assist with D/C of nicotine. He declines CXR despite tobacco history/ risk  5.  Irreg Nevi- advised close monitoring for any change, call if occurs for removal. Advised needs recheck ASAP but he declines with recent check WNL.  6. TOENAIL Fungus- Advised of hygiene, can f/u podiatry if no change with epsom salt soaks/ viks vapor rubs or superglue treatments.

## 2014-08-20 LAB — BASIC METABOLIC PANEL WITH GFR
BUN: 15 mg/dL (ref 6–23)
CO2: 26 mEq/L (ref 19–32)
Calcium: 9.3 mg/dL (ref 8.4–10.5)
Chloride: 102 mEq/L (ref 96–112)
Creat: 0.97 mg/dL (ref 0.50–1.35)
GFR, Est African American: >89 mL/min
GFR, Est Non African American: 83 mL/min
Glucose, Bld: 87 mg/dL (ref 70–99)
Potassium: 4.5 mEq/L (ref 3.5–5.3)
SODIUM: 138 meq/L (ref 135–145)

## 2014-08-20 LAB — URINALYSIS, ROUTINE W REFLEX MICROSCOPIC
Bilirubin Urine: NEGATIVE
Glucose, UA: NEGATIVE mg/dL
Ketones, ur: NEGATIVE mg/dL
Leukocytes, UA: NEGATIVE
Nitrite: NEGATIVE
Protein, ur: NEGATIVE mg/dL
SPECIFIC GRAVITY, URINE: 1.024 (ref 1.005–1.030)
Urobilinogen, UA: 0.2 mg/dL (ref 0.0–1.0)
pH: 5.5 (ref 5.0–8.0)

## 2014-08-20 LAB — LIPID PANEL
Cholesterol: 169 mg/dL (ref 0–200)
HDL: 34 mg/dL — ABNORMAL LOW (ref >=40)
LDL Cholesterol: 112 mg/dL — ABNORMAL HIGH (ref 0–99)
Total CHOL/HDL Ratio: 5 Ratio
Triglycerides: 117 mg/dL (ref <150)
VLDL: 23 mg/dL (ref 0–40)

## 2014-08-20 LAB — MICROALBUMIN / CREATININE URINE RATIO
Creatinine, Urine: 163.2 mg/dL
Microalb Creat Ratio: 5.5 mg/g (ref 0.0–30.0)
Microalb, Ur: 0.9 mg/dL (ref <2.0)

## 2014-08-20 LAB — TESTOSTERONE: Testosterone: 276 ng/dL — ABNORMAL LOW (ref 300–890)

## 2014-08-20 LAB — HEPATIC FUNCTION PANEL
ALT: 24 U/L (ref 0–53)
AST: 18 U/L (ref 0–37)
Albumin: 4.6 g/dL (ref 3.5–5.2)
Alkaline Phosphatase: 77 U/L (ref 39–117)
BILIRUBIN TOTAL: 0.5 mg/dL (ref 0.2–1.2)
Bilirubin, Direct: 0.1 mg/dL (ref 0.0–0.3)
Indirect Bilirubin: 0.4 mg/dL (ref 0.2–1.2)
Total Protein: 6.7 g/dL (ref 6.0–8.3)

## 2014-08-20 LAB — INSULIN, FASTING: INSULIN FASTING, SERUM: 11.1 u[IU]/mL (ref 2.0–19.6)

## 2014-08-20 LAB — VITAMIN D 25 HYDROXY (VIT D DEFICIENCY, FRACTURES): Vit D, 25-Hydroxy: 24 ng/mL — ABNORMAL LOW (ref 30–100)

## 2014-08-20 LAB — URINALYSIS, MICROSCOPIC ONLY
BACTERIA UA: NONE SEEN
Casts: NONE SEEN
Crystals: NONE SEEN
Squamous Epithelial / HPF: NONE SEEN

## 2014-08-20 LAB — TSH: TSH: 1.069 u[IU]/mL (ref 0.350–4.500)

## 2014-08-20 LAB — PSA: PSA: 1.03 ng/mL (ref <=4.00)

## 2014-08-20 LAB — MAGNESIUM: Magnesium: 2.2 mg/dL (ref 1.5–2.5)

## 2014-08-25 ENCOUNTER — Encounter: Payer: Self-pay | Admitting: Physician Assistant

## 2014-08-25 ENCOUNTER — Ambulatory Visit (INDEPENDENT_AMBULATORY_CARE_PROVIDER_SITE_OTHER): Payer: BLUE CROSS/BLUE SHIELD | Admitting: Physician Assistant

## 2014-08-25 VITALS — BP 122/80 | HR 68 | Temp 98.1°F | Resp 16 | Ht 71.5 in | Wt 194.0 lb

## 2014-08-25 DIAGNOSIS — B029 Zoster without complications: Secondary | ICD-10-CM

## 2014-08-25 MED ORDER — PREDNISONE 20 MG PO TABS: ORAL_TABLET | ORAL | Status: DC

## 2014-08-25 NOTE — Patient Instructions (Signed)
Please take the prednisone to help decrease inflammation and therefore decrease symptoms. Take it it with food to avoid GI upset. It can cause increased energy but on the other hand it can make it hard to sleep at night so please take it AT [place], it takes 8-12 hours to start working so it will NOT affect your sleeping if you take it at night with your food!!  If you are diabetic it will increase your sugars so decrease carbs and monitor your sugars closely.     Shingles Shingles (herpes zoster) is an infection that is caused by the same virus that causes chickenpox (varicella). The infection causes a painful skin rash and fluid-filled blisters, which eventually break open, crust over, and heal. It may occur in any area of the body, but it usually affects only one side of the body or face. The pain of shingles usually lasts about 1 month. However, some people with shingles may develop long-term (chronic) pain in the affected area of the body. Shingles often occurs many years after the person had chickenpox. It is more common:  In people older than 50 years.  In people with weakened immune systems, such as those with HIV, AIDS, or cancer.  In people taking medicines that weaken the immune system, such as transplant medicines.  In people under great stress. CAUSES  Shingles is caused by the varicella zoster virus (VZV), which also causes chickenpox. After a person is infected with the virus, it can remain in the person's body for years in an inactive state (dormant). To cause shingles, the virus reactivates and breaks out as an infection in a nerve root. The virus can be spread from person to person (contagious) through contact with open blisters of the shingles rash. It will only spread to people who have not had chickenpox. When these people are exposed to the virus, they may develop chickenpox. They will not develop shingles. Once the blisters scab over, the person is no longer  contagious and cannot spread the virus to others. SIGNS AND SYMPTOMS  Shingles shows up in stages. The initial symptoms may be pain, itching, and tingling in an area of the skin. This pain is usually described as burning, stabbing, or throbbing.In a few days or weeks, a painful red rash will appear in the area where the pain, itching, and tingling were felt. The rash is usually on one side of the body in a band or belt-like pattern. Then, the rash usually turns into fluid-filled blisters. They will scab over and dry up in approximately 2-3 weeks. Flu-like symptoms may also occur with the initial symptoms, the rash, or the blisters. These may include:  Fever.  Chills.  Headache.  Upset stomach. DIAGNOSIS  Your health care provider will perform a skin exam to diagnose shingles. Skin scrapings or fluid samples may also be taken from the blisters. This sample will be examined under a microscope or sent to a lab for further testing. TREATMENT  There is no specific cure for shingles. Your health care provider will likely prescribe medicines to help you manage the pain, recover faster, and avoid long-term problems. This may include antiviral drugs, anti-inflammatory drugs, and pain medicines. HOME CARE INSTRUCTIONS   Take a cool bath or apply cool compresses to the area of the rash or blisters as directed. This may help with the pain and itching.   Take medicines only as directed by your health care provider.   Rest as directed by your health  care provider.  Keep your rash and blisters clean with mild soap and cool water or as directed by your health care provider.  Do not pick your blisters or scratch your rash. Apply an anti-itch cream or numbing creams to the affected area as directed by your health care provider.  Keep your shingles rash covered with a loose bandage (dressing).  Avoid skin contact with:  Babies.   Pregnant women.   Children with eczema.   Elderly people with  transplants.   People with chronic illnesses, such as leukemia or AIDS.   Wear loose-fitting clothing to help ease the pain of material rubbing against the rash.  Keep all follow-up visits as directed by your health care provider.If the area involved is on your face, you may receive a referral for a specialist, such as an eye doctor (ophthalmologist) or an ear, nose, and throat (ENT) doctor. Keeping all follow-up visits will help you avoid eye problems, chronic pain, or disability.  SEEK IMMEDIATE MEDICAL CARE IF:   You have facial pain, pain around the eye area, or loss of feeling on one side of your face.  You have ear pain or ringing in your ear.  You have loss of taste.  Your pain is not relieved with prescribed medicines.   Your redness or swelling spreads.   You have more pain and swelling.  Your condition is worsening or has changed.   You have a fever. MAKE SURE YOU:  Understand these instructions.  Will watch your condition.  Will get help right away if you are not doing well or get worse. Document Released: 06/06/2005 Document Revised: 10/21/2013 Document Reviewed: 01/19/2012 [facility] Patient Information 2015 [place], [place]. This information is not intended to replace advice given to you by your health care provider. Make sure you discuss any questions you have with your health care provider.

## 2014-08-25 NOTE — Progress Notes (Signed)
   Subjective:    Patient ID: Adam Hammond, male    DOB: [birth year], 64 y.o.   MRN: [id number]  HPI 64 y.o. WM  has a past medical history of Nephrolithiasis (2006); Actinic keratitis; BCC (basal cell carcinoma), ear; Elevated blood pressure reading without diagnosis of hypertension; Anemia; and Hypogonadism male. presents with right arm rash x Thursday. He states he helped someone move a week ago, started to have burning pain/soreness in right arm from moving but then developed rash on Thursday. It is grouped vesicles on right arm, burning pain.   Review of Systems  Constitutional: Negative.   HENT: Negative.   Respiratory: Negative.   Cardiovascular: Negative.   Gastrointestinal: Negative.   Genitourinary: Negative.   Musculoskeletal: Positive for myalgias (right arm). Negative for back pain, joint swelling, arthralgias, gait problem, neck pain and neck stiffness.  Skin: Positive for rash. Negative for color change, pallor and wound.  Neurological: Negative.        Objective:   Physical Exam  Constitutional: He is oriented to person, place, and time. He appears well-developed and well-nourished.  HENT:  Head: Normocephalic and atraumatic.  Eyes: Conjunctivae are normal. Pupils are equal, round, and reactive to light.  Neck: Normal range of motion. Neck supple.  Cardiovascular: Normal rate and regular rhythm.   Pulmonary/Chest: Effort normal and breath sounds normal.  Abdominal: Soft. Bowel sounds are normal.  Musculoskeletal: Normal range of motion.  Lymphadenopathy:    He has no cervical adenopathy.  Neurological: He is alert and oriented to person, place, and time.  Skin: Skin is warm and dry. Rash noted. Rash is vesicular (grouped along dermatome on right arm).      Assessment & Plan:  Herpes Zoster- Prednisone,declines acyclovir, lyrica The patient was informed that the vesicles can cause chicken pox in a person that is not immune, will follow up PRN/sx's don't  improve

## 2014-08-28 ENCOUNTER — Other Ambulatory Visit: Payer: BLUE CROSS/BLUE SHIELD

## 2014-08-28 DIAGNOSIS — R319 Hematuria, unspecified: Secondary | ICD-10-CM

## 2014-08-28 DIAGNOSIS — E785 Hyperlipidemia, unspecified: Secondary | ICD-10-CM

## 2014-08-28 LAB — LIPID PANEL
CHOL/HDL RATIO: 5.5 ratio
CHOLESTEROL: 160 mg/dL (ref 0–200)
HDL: 29 mg/dL — ABNORMAL LOW (ref >=40)
LDL Cholesterol: 107 mg/dL — ABNORMAL HIGH (ref 0–99)
Triglycerides: 121 mg/dL (ref <150)
VLDL: 24 mg/dL (ref 0–40)

## 2014-08-29 LAB — URINALYSIS, ROUTINE W REFLEX MICROSCOPIC
BILIRUBIN URINE: NEGATIVE
Glucose, UA: NEGATIVE mg/dL
Ketones, ur: NEGATIVE mg/dL
Leukocytes, UA: NEGATIVE
NITRITE: NEGATIVE
Protein, ur: NEGATIVE mg/dL
Specific Gravity, Urine: 1.025 (ref 1.005–1.030)
Urobilinogen, UA: 0.2 mg/dL (ref 0.0–1.0)
pH: 5.5 (ref 5.0–8.0)

## 2014-08-29 LAB — URINALYSIS, MICROSCOPIC ONLY
BACTERIA UA: NONE SEEN
CASTS: NONE SEEN
Crystals: NONE SEEN
Squamous Epithelial / LPF: NONE SEEN

## 2014-08-29 LAB — URINE CULTURE
COLONY COUNT: NO GROWTH
Organism ID, Bacteria: NO GROWTH

## 2014-08-30 ENCOUNTER — Other Ambulatory Visit: Payer: Self-pay | Admitting: Physician Assistant

## 2014-08-30 DIAGNOSIS — R319 Hematuria, unspecified: Secondary | ICD-10-CM

## 2015-08-19 ENCOUNTER — Encounter: Payer: Self-pay | Admitting: Physician Assistant

## 2015-08-19 ENCOUNTER — Ambulatory Visit (INDEPENDENT_AMBULATORY_CARE_PROVIDER_SITE_OTHER): Payer: Managed Care, Other (non HMO) | Admitting: Physician Assistant

## 2015-08-19 VITALS — BP 124/78 | HR 69 | Temp 97.3°F | Resp 16 | Ht 71.0 in | Wt 197.0 lb

## 2015-08-19 DIAGNOSIS — M545 Low back pain, unspecified: Secondary | ICD-10-CM

## 2015-08-19 MED ORDER — CYCLOBENZAPRINE HCL 10 MG PO TABS: 10.0000 mg | ORAL_TABLET | Freq: Three times a day (TID) | ORAL | Status: DC | PRN

## 2015-08-19 MED ORDER — MELOXICAM 15 MG PO TABS: ORAL_TABLET | ORAL | Status: DC

## 2015-08-19 NOTE — Patient Instructions (Addendum)
Take the mobic x 2 weeks Take muscle relaxer as needed Use heat Will be light duty at work x 2 weeks   Low Back Sprain With Rehab A sprain is an injury in which a ligament is torn. The ligaments of the lower back are vulnerable to sprains. However, they are strong and require great force to be injured. These ligaments are important for stabilizing the spinal column. Sprains are classified into three categories. Grade 1 sprains cause pain, but the tendon is not lengthened. Grade 2 sprains include a lengthened ligament, due to the ligament being stretched or partially ruptured. With grade 2 sprains there is still function, although the function may be decreased. Grade 3 sprains involve a complete tear of the tendon or muscle, and function is usually impaired. SYMPTOMS   Severe pain in the lower back.  Sometimes, a feeling of a "pop," "snap," or tear, at the time of injury.  Tenderness and sometimes swelling at the injury site.  Uncommonly, bruising (contusion) within 48 hours of injury.  Muscle spasms in the back. CAUSES  Low back sprains occur when a force is placed on the ligaments that is greater than they can handle. Common causes of injury include:  Performing a stressful act while off-balance.  Repetitive stressful activities that involve movement of the lower back.  Direct hit (trauma) to the lower back. RISK INCREASES WITH:  Contact sports (football, wrestling).  Collisions (major skiing accidents).  Sports that require throwing or lifting (baseball, weightlifting).  Sports involving twisting of the spine (gymnastics, diving, tennis, golf).  Poor strength and flexibility.  Inadequate protection.  Previous back injury or surgery (especially fusion). PREVENTION  Wear properly fitted and padded protective equipment.  Warm up and stretch properly before activity.  Allow for adequate recovery between workouts.  Maintain physical fitness:  Strength, flexibility,  and endurance.  Cardiovascular fitness.  Maintain a healthy body weight. PROGNOSIS  If treated properly, low back sprains usually heal with non-surgical treatment. The length of time for healing depends on the severity of the injury.  RELATED COMPLICATIONS   Recurring symptoms, resulting in a chronic problem.  Chronic inflammation and pain in the low back.  Delayed healing or resolution of symptoms, especially if activity is resumed too soon.  Prolonged impairment.  Unstable or arthritic joints of the low back. TREATMENT  Treatment first involves the use of ice and medicine, to reduce pain and inflammation. The use of strengthening and stretching exercises may help reduce pain with activity. These exercises may be performed at home or with a therapist. Severe injuries may require referral to a therapist for further evaluation and treatment, such as ultrasound. Your caregiver may advise that you wear a back brace or corset, to help reduce pain and discomfort. Often, prolonged bed rest results in greater harm then benefit. Corticosteroid injections may be recommended. However, these should be reserved for the most serious cases. It is important to avoid using your back when lifting objects. At night, sleep on your back on a firm mattress, with a pillow placed under your knees. If non-surgical treatment is unsuccessful, surgery may be needed.  MEDICATION   If pain medicine is needed, nonsteroidal anti-inflammatory medicines (aspirin and ibuprofen), or other minor pain relievers (acetaminophen), are often advised.  Do not take pain medicine for 7 days before surgery.  Prescription pain relievers may be given, if your caregiver thinks they are needed. Use only as directed and only as much as you need.  Ointments applied to the  skin may be helpful.  Corticosteroid injections may be given by your caregiver. These injections should be reserved for the most serious cases, because they may only  be given a certain number of times. HEAT AND COLD  Cold treatment (icing) should be applied for 10 to 15 minutes every 2 to 3 hours for inflammation and pain, and immediately after activity that aggravates your symptoms. Use ice packs or an ice massage.  Heat treatment may be used before performing stretching and strengthening activities prescribed by your caregiver, physical therapist, or athletic trainer. Use a heat pack or a warm water soak. SEEK MEDICAL CARE IF:   Symptoms get worse or do not improve in 2 to 4 weeks, despite treatment.  You develop numbness or weakness in either leg.  You lose bowel or bladder function.  Any of the following occur after surgery: fever, increased pain, swelling, redness, drainage of fluids, or bleeding in the affected area.  New, unexplained symptoms develop. (Drugs used in treatment may produce side effects.) EXERCISES  RANGE OF MOTION (ROM) AND STRETCHING EXERCISES - Low Back Sprain Most people with lower back pain will find that their symptoms get worse with excessive bending forward (flexion) or arching at the lower back (extension). The exercises that will help resolve your symptoms will focus on the opposite motion.  Your physician, physical therapist or athletic trainer will help you determine which exercises will be most helpful to resolve your lower back pain. Do not complete any exercises without first consulting with your caregiver. Discontinue any exercises which make your symptoms worse, until you speak to your caregiver. If you have pain, numbness or tingling which travels down into your buttocks, leg or foot, the goal of the therapy is for these symptoms to move closer to your back and eventually resolve. Sometimes, these leg symptoms will get better, but your lower back pain may worsen. This is often an indication of progress in your rehabilitation. Be very alert to any changes in your symptoms and the activities in which you participated in  the 24 hours prior to the change. Sharing this information with your caregiver will allow him or her to most efficiently treat your condition. These exercises may help you when beginning to rehabilitate your injury. Your symptoms may resolve with or without further involvement from your physician, physical therapist or athletic trainer. While completing these exercises, remember:   Restoring tissue flexibility helps normal motion to return to the joints. This allows healthier, less painful movement and activity.  An effective stretch should be held for at least 30 seconds.  A stretch should never be painful. You should only feel a gentle lengthening or release in the stretched tissue. FLEXION RANGE OF MOTION AND STRETCHING EXERCISES: STRETCH - Flexion, Single Knee to Chest   Lie on a firm bed or floor with both legs extended in front of you.  Keeping one leg in contact with the floor, bring your opposite knee to your chest. Hold your leg in place by either grabbing behind your thigh or at your knee.  Pull until you feel a gentle stretch in your low back. Hold __________ seconds.  Slowly release your grasp and repeat the exercise with the opposite side. Repeat __________ times. Complete this exercise __________ times per day.  STRETCH - Flexion, Double Knee to Chest  Lie on a firm bed or floor with both legs extended in front of you.  Keeping one leg in contact with the floor, bring your opposite knee to  your chest.  Tense your stomach muscles to support your back and then lift your other knee to your chest. Hold your legs in place by either grabbing behind your thighs or at your knees.  Pull both knees toward your chest until you feel a gentle stretch in your low back. Hold __________ seconds.  Tense your stomach muscles and slowly return one leg at a time to the floor. Repeat __________ times. Complete this exercise __________ times per day.  STRETCH - Low Trunk Rotation  Lie on a  firm bed or floor. Keeping your legs in front of you, bend your knees so they are both pointed toward the ceiling and your feet are flat on the floor.  Extend your arms out to the side. This will stabilize your upper body by keeping your shoulders in contact with the floor.  Gently and slowly drop both knees together to one side until you feel a gentle stretch in your low back. Hold for __________ seconds.  Tense your stomach muscles to support your lower back as you bring your knees back to the starting position. Repeat the exercise to the other side. Repeat __________ times. Complete this exercise __________ times per day  EXTENSION RANGE OF MOTION AND FLEXIBILITY EXERCISES: STRETCH - Extension, Prone on Elbows   Lie on your stomach on the floor, a bed will be too soft. Place your palms about shoulder width apart and at the height of your head.  Place your elbows under your shoulders. If this is too painful, stack pillows under your chest.  Allow your body to relax so that your hips drop lower and make contact more completely with the floor.  Hold this position for __________ seconds.  Slowly return to lying flat on the floor. Repeat __________ times. Complete this exercise __________ times per day.  RANGE OF MOTION - Extension, Prone Press Ups  Lie on your stomach on the floor, a bed will be too soft. Place your palms about shoulder width apart and at the height of your head.  Keeping your back as relaxed as possible, slowly straighten your elbows while keeping your hips on the floor. You may adjust the placement of your hands to maximize your comfort. As you gain motion, your hands will come more underneath your shoulders.  Hold this position __________ seconds.  Slowly return to lying flat on the floor. Repeat __________ times. Complete this exercise __________ times per day.  RANGE OF MOTION- Quadruped, Neutral Spine   Assume a hands and knees position on a firm surface. Keep  your hands under your shoulders and your knees under your hips. You may place padding under your knees for comfort.  Drop your head and point your tailbone toward the ground below you. This will round out your lower back like an angry cat. Hold this position for __________ seconds.  Slowly lift your head and release your tail bone so that your back sags into a large arch, like an old horse.  Hold this position for __________ seconds.  Repeat this until you feel limber in your low back.  Now, find your "sweet spot." This will be the most comfortable position somewhere between the two previous positions. This is your neutral spine. Once you have found this position, tense your stomach muscles to support your low back.  Hold this position for __________ seconds. Repeat __________ times. Complete this exercise __________ times per day.  STRENGTHENING EXERCISES - Low Back Sprain These exercises may help you when beginning  to rehabilitate your injury. These exercises should be done near your "sweet spot." This is the neutral, low-back arch, somewhere between fully rounded and fully arched, that is your least painful position. When performed in this safe range of motion, these exercises can be used for people who have either a flexion or extension based injury. These exercises may resolve your symptoms with or without further involvement from your physician, physical therapist or athletic trainer. While completing these exercises, remember:   Muscles can gain both the endurance and the strength needed for everyday activities through controlled exercises.  Complete these exercises as instructed by your physician, physical therapist or athletic trainer. Increase the resistance and repetitions only as guided.  You may experience muscle soreness or fatigue, but the pain or discomfort you are trying to eliminate should never worsen during these exercises. If this pain does worsen, stop and make certain you  are following the directions exactly. If the pain is still present after adjustments, discontinue the exercise until you can discuss the trouble with your caregiver. STRENGTHENING - Deep Abdominals, Pelvic Tilt   Lie on a firm bed or floor. Keeping your legs in front of you, bend your knees so they are both pointed toward the ceiling and your feet are flat on the floor.  Tense your lower abdominal muscles to press your low back into the floor. This motion will rotate your pelvis so that your tail bone is scooping upwards rather than pointing at your feet or into the floor. With a gentle tension and even breathing, hold this position for __________ seconds. Repeat __________ times. Complete this exercise __________ times per day.  STRENGTHENING - Abdominals, Crunches   Lie on a firm bed or floor. Keeping your legs in front of you, bend your knees so they are both pointed toward the ceiling and your feet are flat on the floor. Cross your arms over your chest.  Slightly tip your chin down without bending your neck.  Tense your abdominals and slowly lift your trunk high enough to just clear your shoulder blades. Lifting higher can put excessive stress on the lower back and does not further strengthen your abdominal muscles.  Control your return to the starting position. Repeat __________ times. Complete this exercise __________ times per day.  STRENGTHENING - Quadruped, Opposite UE/LE Lift   Assume a hands and knees position on a firm surface. Keep your hands under your shoulders and your knees under your hips. You may place padding under your knees for comfort.  Find your neutral spine and gently tense your abdominal muscles so that you can maintain this position. Your shoulders and hips should form a rectangle that is parallel with the floor and is not twisted.  Keeping your trunk steady, lift your right hand no higher than your shoulder and then your left leg no higher than your hip. Make sure  you are not holding your breath. Hold this position for __________ seconds.  Continuing to keep your abdominal muscles tense and your back steady, slowly return to your starting position. Repeat with the opposite arm and leg. Repeat __________ times. Complete this exercise __________ times per day.  STRENGTHENING - Abdominals and Quadriceps, Straight Leg Raise   Lie on a firm bed or floor with both legs extended in front of you.  Keeping one leg in contact with the floor, bend the other knee so that your foot can rest flat on the floor.  Find your neutral spine, and tense your abdominal muscles  to maintain your spinal position throughout the exercise.  Slowly lift your straight leg off the floor about 6 inches for a count of 15, making sure to not hold your breath.  Still keeping your neutral spine, slowly lower your leg all the way to the floor. Repeat this exercise with each leg __________ times. Complete this exercise __________ times per day. POSTURE AND BODY MECHANICS CONSIDERATIONS - Low Back Sprain Keeping correct posture when sitting, standing or completing your activities will reduce the stress put on different body tissues, allowing injured tissues a chance to heal and limiting painful experiences. The following are general guidelines for improved posture. Your physician or physical therapist will provide you with any instructions specific to your needs. While reading these guidelines, remember:  The exercises prescribed by your provider will help you have the flexibility and strength to maintain correct postures.  The correct posture provides the best environment for your joints to work. All of your joints have less wear and tear when properly supported by a spine with good posture. This means you will experience a healthier, less painful body.  Correct posture must be practiced with all of your activities, especially prolonged sitting and standing. Correct posture is as important  when doing repetitive low-stress activities (typing) as it is when doing a single heavy-load activity (lifting). RESTING POSITIONS Consider which positions are most painful for you when choosing a resting position. If you have pain with flexion-based activities (sitting, bending, stooping, squatting), choose a position that allows you to rest in a less flexed posture. You would want to avoid curling into a fetal position on your side. If your pain worsens with extension-based activities (prolonged standing, working overhead), avoid resting in an extended position such as sleeping on your stomach. Most people will find more comfort when they rest with their spine in a more neutral position, neither too rounded nor too arched. Lying on a non-sagging bed on your side with a pillow between your knees, or on your back with a pillow under your knees will often provide some relief. Keep in mind, being in any one position for a prolonged period of time, no matter how correct your posture, can still lead to stiffness. PROPER SITTING POSTURE In order to minimize stress and discomfort on your spine, you must sit with correct posture. Sitting with good posture should be effortless for a healthy body. Returning to good posture is a gradual process. Many people can work toward this most comfortably by using various supports until they have the flexibility and strength to maintain this posture on their own. When sitting with proper posture, your ears will fall over your shoulders and your shoulders will fall over your hips. You should use the back of the chair to support your upper back. Your lower back will be in a neutral position, just slightly arched. You may place a small pillow or folded towel at the base of your lower back for  support.  When working at a desk, create an environment that supports good, upright posture. Without extra support, muscles tire, which leads to excessive strain on joints and other tissues.  Keep these recommendations in mind: CHAIR:  A chair should be able to slide under your desk when your back makes contact with the back of the chair. This allows you to work closely.  The chair's height should allow your eyes to be level with the upper part of your monitor and your hands to be slightly lower than your elbows.  BODY POSITION  Your feet should make contact with the floor. If this is not possible, use a foot rest.  Keep your ears over your shoulders. This will reduce stress on your neck and low back. INCORRECT SITTING POSTURES  If you are feeling tired and unable to assume a healthy sitting posture, do not slouch or slump. This puts excessive strain on your back tissues, causing more damage and pain. Healthier options include:  Using more support, like a lumbar pillow.  Switching tasks to something that requires you to be upright or walking.  Talking a brief walk.  Lying down to rest in a neutral-spine position. PROLONGED STANDING WHILE SLIGHTLY LEANING FORWARD  When completing a task that requires you to lean forward while standing in one place for a long time, place either foot up on a stationary 2-4 inch high object to help maintain the best posture. When both feet are on the ground, the lower back tends to lose its slight inward curve. If this curve flattens (or becomes too large), then the back and your other joints will experience too much stress, tire more quickly, and can cause pain. CORRECT STANDING POSTURES Proper standing posture should be assumed with all daily activities, even if they only take a few moments, like when brushing your teeth. As in sitting, your ears should fall over your shoulders and your shoulders should fall over your hips. You should keep a slight tension in your abdominal muscles to brace your spine. Your tailbone should point down to the ground, not behind your body, resulting in an over-extended swayback posture.  INCORRECT STANDING POSTURES    Common incorrect standing postures include a forward head, locked knees and/or an excessive swayback. WALKING Walk with an upright posture. Your ears, shoulders and hips should all line-up. PROLONGED ACTIVITY IN A FLEXED POSITION When completing a task that requires you to bend forward at your waist or lean over a low surface, try to find a way to stabilize 3 out of 4 of your limbs. You can place a hand or elbow on your thigh or rest a knee on the surface you are reaching across. This will provide you more stability, so that your muscles do not tire as quickly. By keeping your knees relaxed, or slightly bent, you will also reduce stress across your lower back. CORRECT LIFTING TECHNIQUES DO :  Assume a wide stance. This will provide you more stability and the opportunity to get as close as possible to the object which you are lifting.  Tense your abdominals to brace your spine. Bend at the knees and hips. Keeping your back locked in a neutral-spine position, lift using your leg muscles. Lift with your legs, keeping your back straight.  Test the weight of unknown objects before attempting to lift them.  Try to keep your elbows locked down at your sides in order get the best strength from your shoulders when carrying an object.  Always ask for help when lifting heavy or awkward objects. INCORRECT LIFTING TECHNIQUES DO NOT:   Lock your knees when lifting, even if it is a small object.  Bend and twist. Pivot at your feet or move your feet when needing to change directions.  Assume that you can safely pick up even a paperclip without proper posture.   This information is not intended to replace advice given to you by your health care provider. Make sure you discuss any questions you have with your health care provider.   Document Released: 06/06/2005  Document Revised: 06/27/2014 Document Reviewed: 09/18/2008 Elsevier Interactive Patient Education [employer].

## 2015-08-19 NOTE — Progress Notes (Signed)
   Subjective:    Patient ID: Adam Hammond, male    DOB: Jun 04, 1951, 65 y.o.   MRN: [id number]  HPI 65 y.o. WM with history of nephrolithiasis, HTN, anemia presents with lower back pain x several weeks. Has had chronic back pain since fall off a telephone pole 20 years ago, has never had surgery on back. No specific injury recently, just very active at work, takes ibuprofen PRN. Patient denies fever, hematuria, incontinence, numbness, tingling, weakness and saddle anesthesia. Works with   Blood pressure 124/78, pulse 69, temperature 97.3 F (36.3 C), temperature source Temporal, resp. rate 16, height 5\' 11"  (1.803 m), weight 197 lb (89.359 kg), SpO2 97 %.  Review of Systems  Constitutional: Negative.  Negative for fever.  HENT: Negative.   Respiratory: Negative.   Cardiovascular: Negative.  Negative for chest pain.  Gastrointestinal: Negative.  Negative for abdominal pain.  Genitourinary: Negative.  Negative for dysuria.  Musculoskeletal: Positive for back pain and gait problem. Negative for myalgias, joint swelling, arthralgias, neck pain and neck stiffness.  Skin: Negative.   Neurological: Negative.  Negative for weakness, numbness and headaches.       Objective:   Physical Exam  Constitutional: He is oriented to person, place, and time. He appears well-developed and well-nourished. No distress.  Neck: Normal range of motion. Neck supple.  Cardiovascular: Normal rate and regular rhythm.   Pulmonary/Chest: Effort normal and breath sounds normal.  Abdominal: Soft. Bowel sounds are normal. There is no tenderness.  Musculoskeletal:  Patient is able to ambulate well. Gait is  Antalgic. Straight leg raising with dorsiflexion negative bilaterally for radicular symptoms. Sensory exam in the legs are normal. Knee reflexes are normal Ankle reflexes are normal Strength is normal and symmetric in arms and legs. There is not SI tenderness to palpation.  There is paraspinal muscle spasm.  There  is not midline tenderness.  ROM of spine with  limited in all spheres due to pain.   Lymphadenopathy:    He has no cervical adenopathy.  Neurological: He is alert and oriented to person, place, and time. No cranial nerve deficit.  Skin: Skin is warm and dry. No rash noted.       Assessment & Plan:  Lower back pain negative straight leg Need to do rest x 2 weeks, mobic x 2 weeks, muscle relaxer as needed, stretches given Follow up 1 month if not better for Xray  Schedule CPE

## 2015-08-20 ENCOUNTER — Encounter: Payer: Self-pay | Admitting: Emergency Medicine

## 2018-01-08 DIAGNOSIS — Z85828 Personal history of other malignant neoplasm of skin: Secondary | ICD-10-CM | POA: Diagnosis not present

## 2018-01-08 DIAGNOSIS — L570 Actinic keratosis: Secondary | ICD-10-CM | POA: Diagnosis not present

## 2018-01-08 DIAGNOSIS — L821 Other seborrheic keratosis: Secondary | ICD-10-CM | POA: Diagnosis not present

## 2018-10-18 NOTE — Progress Notes (Addendum)
Virtual Visit   I connected with Adam Hammond on 10/19/18 at 11:30 AM EDT by televideo application and verified that I am speaking with the correct person using two identifiers.   I discussed the limitations, risks, security and privacy concerns of performing an evaluation and management service and the availability of in person appointments. I also discussed with the patient that there may be a patient responsible charge related to this service. The patient expressed understanding and agreed to proceed.   MEDICARE ANNUAL WELLNESS VISIT AND FOLLOW UP Assessment:   Diagnoses and all orders for this visit:  Medicare annual wellness visit, subsequent Yearly  Hypogonadism male Asymptomatic  Nephrolithiasis History of Asymptomatic Continue to monitor Increase fluid intake  Elevated blood pressure reading without diagnosis of hypertension Discussed dietary and exercise modifications Discussed DASH diet.   Reminder to go to the ER if any CP, SOB, nausea, dizziness, severe HA, changes vision/speech, left arm numbness and tingling and jaw pain.  Acute bilateral low back pain without sciatica present Managing at this time Takes tylenol if needed Discussed ice & heat Carefull of activities not to strain back or heavy lifting. Continue to monitor Discussed stretching  Anemia, unspecified type Asymptomatic Will check labs next office visit  Vitamin D deficiency Discussed supplementation Will check labs next office visit   Patient to follow up in 3-4 months for complete physical and labs.  Follow Up Instructions:    I discussed the assessment and treatment plan with the patient. The patient was provided an opportunity to ask questions and all were answered. The patient agreed with the plan and demonstrated an understanding of the instructions.   The patient was advised to call back or seek an in-person evaluation if the symptoms worsen or if the condition fails to improve  as anticipated.  I provided 30 minutes of non-face-to-face time during this encounter including counseling, chart review, and critical decision making was preformed.   No future appointments.    Plan:   During the course of the visit the patient was educated and counseled about appropriate screening and preventive services including:    Pneumococcal vaccine   Influenza vaccine  Prevnar 13  Td vaccine  Screening electrocardiogram, deferred, telephone visit [place].  Colorectal cancer screening  Diabetes screening  Glaucoma screening  Nutrition counseling    Subjective:  Adam Hammond is a 68 y.o. male who presents for Medicare Annual Wellness Visit.  He has history of elevated blood pressure without diagnosis of HTN, nephrolithiasis, hypogonadism, anemia and low back pain.  Patient is over due for physical.  Last appointment was 3 years ago for low back pain.  He was treated with a round of meloxicam and muscle relaxants prn.  Plan for imaging if no improvement.  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   Today he denies any low back pain. He is managing well does not take any medications.  He is not prevented from his daily activities.  He has no health concerns today and does not take any regular medications. He does not have blood pressure or vitals to report today. He does workout by means of walking his dogs and yard work. He denies chest pain, shortness of breath, dizziness.  He is not on cholesterol medication and denies myalgias. His cholesterol is not at goal but last check was over three years ago. The cholesterol last visit was:   Lab Results  Component Value Date   CHOL 160 08/28/2014  HDL 29 (L) 08/28/2014   LDLCALC 107 (H) 08/28/2014   TRIG 121 08/28/2014   CHOLHDL 5.5 08/28/2014   He has not been working on diet and exercise for prediabetes, and denies hyperglycemia, hypoglycemia , increased appetite, nausea, paresthesia of the  feet, polydipsia, polyuria, visual disturbances, vomiting and weight loss. Last A1C in the office was:  Lab Results  Component Value Date   HGBA1C 5.6 08/19/2014   Last GFR Lab Results  Component Value Date   GFRNONAA 83 08/19/2014     Lab Results  Component Value Date   GFRAA >89 08/19/2014   Patient is on not Vitamin D supplement.   Lab Results  Component Value Date   VD25OH 24 (L) 08/19/2014     He has a history of testosterone deficiency and is not on testosterone replacement. He denies decrease in energy, libido, or muscle mass.  He is not on testosterone replacement. Lab Results  Component Value Date   TESTOSTERONE 276 (L) 08/19/2014   Medication Review: No current outpatient medications on file.  Allergies: No Known Allergies  Current Problems (verified) has Nephrolithiasis; BCC (basal cell carcinoma), ear; Elevated blood pressure reading without diagnosis of hypertension; and Hypogonadism male on their problem list.  Screening Tests Immunization History  Administered Date(s) Administered  . DTaP 05/20/2006  . PPD Test 07/17/2013  . Td 05/20/2006  . Tdap 08/19/2014    Preventative care: Last colonoscopy: Declines at this time  Prior vaccinations: TD or Tdap: 2016  Influenza: Declines  Pneumococcal: Declines Prevnar13: Declines Shingles/Zostavax: Declines Shingrix, DUE   Names of Other Physician/Practitioners you currently use: 1. [facility] here for primary care 2. Eye Exam, DUE, to schedule after COVID19 restrictions lifted 3. Dental Exam: DUE,  to schedule after COVID19 restrictions lifted  CXR 2014 DERM: 2016, WNL  Patient Care Team: Unk Pinto, MD as PCP - General (Internal Medicine) Jannette Spanner, MD as Referring Physician (Dermatology)  Surgical: He  has a past surgical history that includes Shoulder surgery (Left, 1997); Wrist surgery (Left, 1997); and Mohs surgery (Left,  2014). Family His family history includes Alcohol abuse in his father; Cancer in his father, mother, and another family member; Diabetes in his sister; Heart attack in his father and sister; Heart disease in his father, sister, and sister; Hyperlipidemia in his father, mother, sister, and sister; Hypertension in his mother. Social history  He reports that he quit smoking about 6 years ago. He does not have any smokeless tobacco history on file. He reports that he does not use drugs. No history on file for alcohol.  MEDICARE WELLNESS OBJECTIVES: Physical activity: Current Exercise Habits: Home exercise routine, Type of exercise: walking, Time (Minutes): 15, Frequency (Times/Week): 7, Weekly Exercise (Minutes/Week): 105, Intensity: Mild, Exercise limited by: orthopedic condition(s) Cardiac risk factors: Cardiac Risk Factors include: advanced age (>76men, >43 women);male gender Depression/mood screen:   Depression screen [facility] 2/9 10/19/2018  Decreased Interest 0  Down, Depressed, Hopeless 0  PHQ - 2 Score 0    ADLs:  In your present state of health, do you have any difficulty performing the following activities: 10/19/2018  Hearing? N  Vision? N  Difficulty concentrating or making decisions? N  Walking or climbing stairs? N  Dressing or bathing? N  Doing errands, shopping? N  Preparing Food and eating ? N  Using the Toilet? N  In the past six months, have you accidently leaked urine? N  Managing your Medications? N  Managing your Finances? N  Housekeeping or managing your Housekeeping? N  Some recent data might be hidden     Cognitive Testing  Alert? Yes  Normal Appearance?Yes  Oriented to person? Yes  Place? Yes   Time? Yes  Recall of three objects?  Yes  Can perform simple calculations? Yes  Displays appropriate judgment?Yes  Can read the correct time from a watch face?Yes  EOL planning: Does Patient Have a Medical Advance Directive?: No Would patient like information on creating  a medical advance directive?: No - Patient declined   Objective:   Today's Vitals   10/19/18 1229  Weight: 195 lb (88.5 kg)  Height: 5\' 11"  (1.803 m)  PainSc: 0-No pain   Body mass index is 27.2 kg/m.  General : Well sounding patient in no apparent distress HEENT: no hoarseness, no cough for duration of visit Lungs: speaks in complete sentences, no audible wheezing, no apparent distress Neurological: alert, oriented x 3 Psychiatric: pleasant, judgement appropriate   Medicare Attestation I have personally reviewed: The patient's medical and social history Their use of alcohol, tobacco or illicit drugs Their current medications and supplements The patient's functional ability including ADLs,fall risks, home safety risks, cognitive, and hearing and visual impairment Diet and physical activities Evidence for depression or mood disorders  The patient's weight, height, BMI, and visual acuity have been recorded in the chart.  I have made referrals, counseling, and provided education to the patient based on review of the above and I have provided the patient with a written personalized care plan for preventive services.     Garnet Sierras, NP [facility] 10/19/2018  12:32 PM

## 2018-10-19 ENCOUNTER — Encounter: Payer: Self-pay | Admitting: Adult Health Nurse Practitioner

## 2018-10-19 ENCOUNTER — Ambulatory Visit: Payer: PPO | Admitting: Adult Health Nurse Practitioner

## 2018-10-19 ENCOUNTER — Other Ambulatory Visit: Payer: Self-pay

## 2018-10-19 VITALS — Ht 71.0 in | Wt 195.0 lb

## 2018-10-19 DIAGNOSIS — M545 Low back pain, unspecified: Secondary | ICD-10-CM

## 2018-10-19 DIAGNOSIS — N200 Calculus of kidney: Secondary | ICD-10-CM | POA: Diagnosis not present

## 2018-10-19 DIAGNOSIS — R030 Elevated blood-pressure reading, without diagnosis of hypertension: Secondary | ICD-10-CM | POA: Diagnosis not present

## 2018-10-19 DIAGNOSIS — Z0001 Encounter for general adult medical examination with abnormal findings: Secondary | ICD-10-CM

## 2018-10-19 DIAGNOSIS — D649 Anemia, unspecified: Secondary | ICD-10-CM

## 2018-10-19 DIAGNOSIS — E291 Testicular hypofunction: Secondary | ICD-10-CM

## 2018-10-19 DIAGNOSIS — E559 Vitamin D deficiency, unspecified: Secondary | ICD-10-CM | POA: Diagnosis not present

## 2018-10-19 DIAGNOSIS — R6889 Other general symptoms and signs: Secondary | ICD-10-CM | POA: Diagnosis not present

## 2018-10-19 DIAGNOSIS — Z0000 Encounter for general adult medical examination without abnormal findings: Secondary | ICD-10-CM

## 2018-10-19 NOTE — Patient Instructions (Addendum)
Today you had a telephone visit with Adam Sierras, Adam Hammond.  Below is a summary of your visit.   Adam Hammond , Thank you for taking time to come for your Medicare Wellness Visit. I appreciate your ongoing commitment to your health goals. Please review the following plan we discussed and let me know if I can assist you in the future.    This is a list of the screening recommended for you and due dates:  Health Maintenance  Topic Date Due  . Colon Cancer Screening  02/18/2019*  . Pneumonia vaccines (1 of 2 - PCV13) 02/18/2019*  .  Hepatitis C: One time screening is recommended by Center for Disease Control  (CDC) for  adults born from [birth year] through 1963.   02/18/2019*  . Flu Shot  02/19/2019  . Tetanus Vaccine  08/18/2024  *Topic was postponed. The date shown is not the original due date.   Once the [place] restrictions have been lifted you are due for an eye and dental exam.  You are eligable to received the Shingrix vaccination.  Below is some general information about this.  Ask insurance and pharmacy about shingrix - new vaccine   Can go to [email or web address] for more information  Shingrix Vaccination  Two vaccines are licensed and recommended to prevent shingles in the [place] Zoster vaccine live (ZVL, Zostavax) has been in use since 2006. Recombinant zoster vaccine (RZV, Shingrix), has been in use since 2017 and is recommended by ACIP as the preferred shingles vaccine.  What Everyone Should Know about Shingles Vaccine (Shingrix) One of the Recommended Vaccines by Disease Shingles vaccination is the only way to protect against shingles and postherpetic neuralgia (PHN), the most common complication from shingles. CDC recommends that healthy adults 50 years and older get two doses of the shingles vaccine called Shingrix (recombinant zoster vaccine), separated by 2 to 6 months, to prevent shingles and the complications from the disease.  Your doctor or pharmacist can give you Shingrix as a shot in your upper arm. Shingrix provides strong protection against shingles and PHN. Two doses of Shingrix is more than 90% effective at preventing shingles and PHN. Protection stays above 85% for at least the first four years after you get vaccinated. Shingrix is the preferred vaccine, over Zostavax (zoster vaccine live), a shingles vaccine in use since 2006. Zostavax may still be used to prevent shingles in healthy adults 60 years and older. For example, you could use Zostavax if a person is allergic to Shingrix, prefers Zostavax, or requests immediate vaccination and Shingrix is unavailable. Who Should Get Shingrix? Healthy adults 50 years and older should get two doses of Shingrix, separated by 2 to 6 months. You should get Shingrix even if in the past you . had shingles  . received Zostavax  . are not sure if you had chickenpox There is no maximum age for getting Shingrix. If you had shingles in the past, you can get Shingrix to help prevent future occurrences of the disease. There is no specific length of time that you need to wait after having shingles before you can receive Shingrix, but generally you should make sure the shingles rash has gone away before getting vaccinated. You can get Shingrix whether or not you remember having had chickenpox in the past. Studies show that more than 99% of Americans 40 years and older have had chickenpox, even if they don't remember having the disease. Chickenpox and shingles are related because they are caused by the same virus (varicella  zoster virus). After a person recovers from chickenpox, the virus stays dormant (inactive) in the body. It can reactivate years later and cause shingles. If you had Zostavax in the recent past, you should wait at least eight weeks before getting Shingrix. Talk to your healthcare provider to determine the best time to get Shingrix. Shingrix is available in [employer] [facility]  and pharmacies. To find doctor's offices or pharmacies near you that offer the vaccine, visit HealthMap Vaccine FinderExternal. If you have questions about Shingrix, talk with your healthcare provider. Vaccine for Those 52 Years and Older  Shingrix reduces the risk of shingles and PHN by more than 90% in people 43 and older. CDC recommends the vaccine for healthy adults 56 and older.  Who Should Not Get Shingrix? You should not get Shingrix if you: . have ever had a severe allergic reaction to any component of the vaccine or after a dose of Shingrix  . tested negative for immunity to varicella zoster virus. If you test negative, you should get chickenpox vaccine.  . currently have shingles  . currently are pregnant or breastfeeding. Women who are pregnant or breastfeeding should wait to get Shingrix.  Marland Kitchen receive specific antiviral drugs (acyclovir, famciclovir, or valacyclovir) 24 hours before vaccination (avoid use of these antiviral drugs for 14 days after vaccination)- zoster vaccine live only If you have a minor acute (starts suddenly) illness, such as a cold, you may get Shingrix. But if you have a moderate or severe acute illness, you should usually wait until you recover before getting the vaccine. This includes anyone with a temperature of 101.84F or higher. The side effects of the Shingrix are temporary, and usually last 2 to 3 days. While you may experience pain for a few days after getting Shingrix, the pain will be less severe than having shingles and the complications from the disease. How Well Does Shingrix Work? Two doses of Shingrix provides strong protection against shingles and postherpetic neuralgia (PHN), the most common complication of shingles. . In adults 69 to 66 years old who got two doses, Shingrix was 97% effective in preventing shingles; among adults 70 years and older, Shingrix was 91% effective.  . In adults 71 to 66 years old who got two doses, Shingrix was 91%  effective in preventing PHN; among adults 70 years and older, Shingrix was 89% effective. Shingrix protection remained high (more than 85%) in people 70 years and older throughout the four years following vaccination. Since your risk of shingles and PHN increases as you get older, it is important to have strong protection against shingles in your older years. Top of Page  What Are the Possible Side Effects of Shingrix? Studies show that Shingrix is safe. The vaccine helps your body create a strong defense against shingles. As a result, you are likely to have temporary side effects from getting the shots. The side effects may affect your ability to do normal daily activities for 2 to 3 days. Most people got a sore arm with mild or moderate pain after getting Shingrix, and some also had redness and swelling where they got the shot. Some people felt tired, had muscle pain, a headache, shivering, fever, stomach pain, or nausea. About 1 out of 6 people who got Shingrix experienced side effects that prevented them from doing regular activities. Symptoms went away on their own in about 2 to 3 days. Side effects were more common in younger people. You might have a reaction to the first or second dose  of Shingrix, or both doses. If you experience side effects, you may choose to take over-the-counter pain medicine such as ibuprofen or acetaminophen. If you experience side effects from Shingrix, you should report them to the Vaccine Adverse Event Reporting System (VAERS). Your doctor might file this report, or you can do it yourself through the VAERS websiteExternal, or by calling [phone]. If you have any questions about side effects from Shingrix, talk with your doctor. The shingles vaccine does not contain thimerosal (a preservative containing mercury). Top of Page  When Should I See a Doctor Because of the Side Effects I Experience From Shingrix? In clinical trials, Shingrix was not associated with  serious adverse events. In fact, serious side effects from vaccines are extremely rare. For example, for every 1 million doses of a vaccine given, only one or two people may have a severe allergic reaction. Signs of an allergic reaction happen within minutes or hours after vaccination and include hives, swelling of the face and throat, difficulty breathing, a fast heartbeat, dizziness, or weakness. If you experience these or any other life-threatening symptoms, see a doctor right away. Shingrix causes a strong response in your immune system, so it may produce short-term side effects more intense than you are used to from other vaccines. These side effects can be uncomfortable, but they are expected and usually go away on their own in 2 or 3 days. Top of Page  How Can I Pay For Shingrix? There are several ways shingles vaccine may be paid for: Medicare . Medicare Part D plans cover the shingles vaccine, but there may be a cost to you depending on your plan. There may be a copay for the vaccine, or you may need to pay in full then get reimbursed for a certain amount.  . Medicare Part B does not cover the shingles vaccine. Medicaid . Medicaid may or may not cover the vaccine. Contact your insurer to find out. Private health insurance . Many private health insurance plans will cover the vaccine, but there may be a cost to you depending on your plan. Contact your insurer to find out. Vaccine assistance programs . Some pharmaceutical companies provide vaccines to eligible adults who cannot afford them. You may want to check with the vaccine manufacturer, [employer], about Shingrix. If you do not currently have health insurance, learn more about affordable health coverage optionsExternal. To find doctor's offices or pharmacies near you that offer the vaccine, visit HealthMap Vaccine FinderExternal.

## 2019-01-14 ENCOUNTER — Ambulatory Visit (INDEPENDENT_AMBULATORY_CARE_PROVIDER_SITE_OTHER): Payer: PPO | Admitting: Physician Assistant

## 2019-01-14 ENCOUNTER — Encounter: Payer: Self-pay | Admitting: Physician Assistant

## 2019-01-14 ENCOUNTER — Other Ambulatory Visit: Payer: Self-pay

## 2019-01-14 VITALS — BP 120/72 | HR 73 | Temp 97.6°F | Ht 71.0 in | Wt 200.6 lb

## 2019-01-14 DIAGNOSIS — R21 Rash and other nonspecific skin eruption: Secondary | ICD-10-CM | POA: Diagnosis not present

## 2019-01-14 MED ORDER — TRIAMCINOLONE ACETONIDE 0.5 % EX CREA: 1.0000 "application " | TOPICAL_CREAM | Freq: Two times a day (BID) | CUTANEOUS | 2 refills | Status: DC

## 2019-01-14 NOTE — Progress Notes (Signed)
   Subjective:    Patient ID: Adam Hammond, male    DOB: [birth year], 68 y.o.   MRN: [id number]  HPI 68 y.o. WM presents with rash x 4-5 days.  States some itching but not very bad, worse in the AM with blanket on him.    Denies specific medication, food, skin care product, detergent, soap, or other environmental triggers have been identified. She did not experience concomitant cardiopulmonary or GI symptoms.  She has no specific nasal symptom complaints today.  Blood pressure 120/72, pulse 73, temperature 97.6 F (36.4 C), height 5\' 11"  (1.803 m), weight 200 lb 9.6 oz (91 kg), SpO2 98 %.  Medications No current outpatient medications on file prior to visit.   No current facility-administered medications on file prior to visit.     Problem list He has Nephrolithiasis; BCC (basal cell carcinoma), ear; Elevated blood pressure reading without diagnosis of hypertension; and Hypogonadism male on their problem list.   Review of Systems     Objective:   Physical Exam Constitutional:      Appearance: He is well-developed.  HENT:     Head: Normocephalic and atraumatic.  Eyes:     Conjunctiva/sclera: Conjunctivae normal.     Pupils: Pupils are equal, round, and reactive to light.  Neck:     Musculoskeletal: Normal range of motion and neck supple.  Cardiovascular:     Rate and Rhythm: Normal rate and regular rhythm.  Pulmonary:     Effort: Pulmonary effort is normal.     Breath sounds: Normal breath sounds.  Abdominal:     General: Bowel sounds are normal.     Palpations: Abdomen is soft.  Musculoskeletal: Normal range of motion.  Lymphadenopathy:     Cervical: No cervical adenopathy.  Skin:    General: Skin is warm and dry.     Findings: Rash present. Rash is vesicular.     Comments: Spread out vesicular papules concentrated on bilateral ankles, and spread up to his leg, discreet on his legs/thighs. Nothing on chest/AB.   Neurological:     Mental Status: He is alert and  oriented to person, place, and time.          Assessment & Plan:  Adam Hammond was seen today for acute visit and rash.  Diagnoses and all orders for this visit:  Rash -     CBC with Differential/Platelet -     COMPLETE METABOLIC PANEL WITH GFR -     B. burgdorfi antibodies -     Rocky mtn spotted fvr abs pnl(IgG+IgM)  Other orders -     triamcinolone cream (KENALOG) 0.5 %; Apply 1 application topically 2 (two) times daily.   ? [place], if not better may treat for scabies, has dog that is mainly outside dog but has been in x 2 weeks.  The patient was advised to call immediately if he has any concerning symptoms in the interval. The patient voices understanding of current treatment options and is in agreement with the current care plan.The patient knows to call the clinic with any problems, questions or concerns or go to the ER if any further progression of symptoms.

## 2019-01-14 NOTE — Patient Instructions (Addendum)
ALLERGY MEDICATIONS OVER THE COUNTER  Please pick one of the over the counter allergy medications below and take it once daily for allergies.  Zyrtec or certizine at night because it can make you sleepy Cheapest at [employer], [employer], [employer]  Contact us at the end of this week if it is not better   Insect Bite, Adult An insect bite can make your skin red, itchy, and swollen. An insect bite is different from an insect sting, which happens when an insect injects poison (venom) into the skin. Some insects can spread disease to people through a bite. However, most insect bites do not lead to disease and are not serious. What are the causes? Insects may bite for a variety of reasons, including:  Hunger.  To defend themselves. Insects that bite include:  Spiders.  Mosquitoes.  Ticks.  Fleas.  Ants.  Flies.  Kissing bugs.  Chiggers. What are the signs or symptoms? Symptoms of this condition include:  Itching or pain in the bite area.  Redness and swelling in the bite area.  An open wound (skin ulcer). In many cases, symptoms last for 2-4 days. In rare cases, a person may have a severe allergic reaction (anaphylactic reaction) to a bite. Symptoms of an anaphylactic reaction may include:  Feeling warm in the face (flushed). This may include redness.  Itchy, red, swollen areas of skin (hives).  Swelling of the eyes, lips, face, mouth, tongue, or throat.  Difficulty breathing, speaking, or swallowing.  Noisy breathing (wheezing).  Dizziness or light-headedness.  Fainting.  Pain or cramping in the abdomen.  Vomiting.  Diarrhea. How is this diagnosed? This condition is usually diagnosed based on symptoms and a physical exam. How is this treated? Treatment is usually not needed. Symptoms often go away on their own. When treatment is recommended, it may involve:  Applying a cream or lotion to the bite area. This treatment helps with itching.  Taking an antibiotic  medicine. This treatment is needed if the bite area gets infected.  Getting a tetanus shot, if you are not up to date on this vaccine.  Applying ice to the affected area.  Allergy medicines called antihistamines. This treatment may be needed if you develop itching or an allergic reaction to the insect bite.  Giving yourself an epinephrine injection if you have an anaphylactic reaction to a bite. To give the injection, you will use what is commonly called an auto-injector "pen" (pre-filled automatic epinephrine injection device). Your health care provider will teach you how to use an auto-injector pen. Follow these instructions at home: Bite area care   Do not scratch the bite area.  Keep the bite area clean and dry. Wash it every day with soap and water as told by your health care provider.  Check the bite area every day for signs of infection. Check for: ? Redness, swelling, or pain. ? Fluid or blood. ? Warmth. ? Pus or a bad smell. Managing pain, itching, and swelling   You may apply cortisone cream, calamine lotion, or a paste made of baking soda and water to the bite area as told by your health care provider.  If directed, put ice on the bite area. ? Put ice in a plastic bag. ? Place a towel between your skin and the bag. ? Leave the ice on for 20 minutes, 2-3 times a day. General instructions  Apply or take over-the-counter and prescription medicines only as told by your health care provider.  If you were prescribed  an antibiotic medicine, take or apply it as told by your health care provider. Do not stop using the antibiotic even if your condition improves.  Keep all follow-up visits as told by your health care provider. This is important. How is this prevented? To help reduce your risk of insect bites:  When you are outdoors, wear clothing that covers your arms and legs. This is especially important in the early morning and evening.  Use insect repellent. The best  insect repellents contain DEET, picaridin, oil of lemon eucalyptus (OLE), or IR3535.  Consider spraying your clothing with a pesticide called permethrin. Permethrin helps prevent insect bites. It works for several weeks and for up to 5-6 clothing washes. Do not apply permethrin directly to the skin.  If your home windows do not have screens, consider installing them.  If you will be sleeping in an area where there are mosquitoes, consider covering your sleeping area with a mosquito net. Contact a health care provider if:  You have redness, swelling, or pain in the bite area.  You have fluid or blood coming from the bite area.  The bite area feels warm to the touch.  You have pus or a bad smell coming from the bite area.  You have a fever. Get help right away if:  You have joint pain.  You have a rash.  You feel unusually tired or sleepy.  You have neck pain.  You have a headache.  You have unusual weakness.  You develop symptoms of an anaphylactic reaction. These may include: ? Flushed skin. ? Hives. ? Swelling of the eyes, lips, face, mouth, tongue, or throat. ? Difficulty breathing, speaking, or swallowing. ? Wheezing. ? Dizziness or light-headedness. ? Fainting. ? Pain or cramping in the abdomen. ? Vomiting. ? Diarrhea. These symptoms may represent a serious problem that is an emergency. Do not wait to see if the symptoms will go away. Do the following right away:  Use the auto-injector pen as you have been instructed.  Get medical help. Call your local emergency services (911 in the [place] Do not drive yourself to the hospital. Summary  An insect bite can make your skin red, itchy, and swollen.  Treatment is usually not needed. Symptoms often go away on their own. When treatment is recommended, it may involve taking medicine, applying medicine to the area, or applying ice.  Apply or take over-the-counter and prescription medicines only as told by your health  care provider.  Use insect repellent to help prevent insect bites.  Contact a health care provider if you have any signs of infection in the bite area. This information is not intended to replace advice given to you by your health care provider. Make sure you discuss any questions you have with your health care provider. Document Released: 07/14/2004 Document Revised: 12/15/2017 Document Reviewed: 12/15/2017 Elsevier Patient Education  2020 [employer].

## 2019-01-15 LAB — CBC WITH DIFFERENTIAL/PLATELET
Absolute Monocytes: 622 cells/uL (ref 200–950)
Basophils Absolute: 67 cells/uL (ref 0–200)
Basophils Relative: 0.8 %
Eosinophils Absolute: 160 cells/uL (ref 15–500)
Eosinophils Relative: 1.9 %
HCT: 48.7 % (ref 38.5–50.0)
Hemoglobin: 17.1 g/dL (ref 13.2–17.1)
Lymphs Abs: 1865 cells/uL (ref 850–3900)
MCH: 29.1 pg (ref 27.0–33.0)
MCHC: 35.1 g/dL (ref 32.0–36.0)
MCV: 83 fL (ref 80.0–100.0)
MPV: 9.3 fL (ref 7.5–12.5)
Monocytes Relative: 7.4 %
Neutro Abs: 5687 cells/uL (ref 1500–7800)
Neutrophils Relative %: 67.7 %
Platelets: 302 10*3/uL (ref 140–400)
RBC: 5.87 10*6/uL — ABNORMAL HIGH (ref 4.20–5.80)
RDW: 13.2 % (ref 11.0–15.0)
Total Lymphocyte: 22.2 %
WBC: 8.4 10*3/uL (ref 3.8–10.8)

## 2019-01-15 LAB — COMPLETE METABOLIC PANEL WITH GFR
AG Ratio: 1.8 (calc) (ref 1.0–2.5)
ALT: 21 U/L (ref 9–46)
AST: 16 U/L (ref 10–35)
Albumin: 4.6 g/dL (ref 3.6–5.1)
Alkaline phosphatase (APISO): 76 U/L (ref 35–144)
BUN: 13 mg/dL (ref 7–25)
CO2: 23 mmol/L (ref 20–32)
Calcium: 9.2 mg/dL (ref 8.6–10.3)
Chloride: 103 mmol/L (ref 98–110)
Creat: 1.11 mg/dL (ref 0.70–1.25)
GFR, Est African American: 80 mL/min/{1.73_m2} (ref >=60)
GFR, Est Non African American: 69 mL/min/{1.73_m2} (ref >=60)
Globulin: 2.5 g/dL (calc) (ref 1.9–3.7)
Glucose, Bld: 93 mg/dL (ref 65–99)
Potassium: 4.3 mmol/L (ref 3.5–5.3)
Sodium: 137 mmol/L (ref 135–146)
Total Bilirubin: 0.5 mg/dL (ref 0.2–1.2)
Total Protein: 7.1 g/dL (ref 6.1–8.1)

## 2019-01-15 LAB — ROCKY MTN SPOTTED FVR ABS PNL(IGG+IGM)
RMSF IgG: NOT DETECTED
RMSF IgM: NOT DETECTED

## 2019-01-15 LAB — B. BURGDORFI ANTIBODIES: B burgdorferi Ab IgG+IgM: <0.9 index

## 2019-04-15 ENCOUNTER — Encounter: Payer: Self-pay | Admitting: Internal Medicine

## 2019-04-15 DIAGNOSIS — R0989 Other specified symptoms and signs involving the circulatory and respiratory systems: Secondary | ICD-10-CM | POA: Insufficient documentation

## 2019-04-15 DIAGNOSIS — Z1212 Encounter for screening for malignant neoplasm of rectum: Secondary | ICD-10-CM | POA: Insufficient documentation

## 2019-04-15 DIAGNOSIS — R7309 Other abnormal glucose: Secondary | ICD-10-CM | POA: Insufficient documentation

## 2019-04-15 DIAGNOSIS — Z1211 Encounter for screening for malignant neoplasm of colon: Secondary | ICD-10-CM | POA: Insufficient documentation

## 2019-04-15 DIAGNOSIS — E559 Vitamin D deficiency, unspecified: Secondary | ICD-10-CM | POA: Insufficient documentation

## 2019-04-15 DIAGNOSIS — N138 Other obstructive and reflux uropathy: Secondary | ICD-10-CM | POA: Insufficient documentation

## 2019-04-15 NOTE — Progress Notes (Signed)
Annual  Screening/Preventative Visit  & Comprehensive Evaluation & Examination     This very nice 68 y.o. MWM  presents for a Screening /Preventative Visit & comprehensive evaluation and management of multiple medical co-morbidities.  Patient has been followed for HTN, HLD, T2_NIDDM  Prediabetes and Vitamin D Deficiency.     Patient has hx/o an elevated BP of 126/90 in 2007 and 137/90 in 2009, but has been lost to f/u since then.  Today's BP is at goal - 136/82. Patient denies any cardiac symptoms as chest pain, palpitations, shortness of breath, dizziness or ankle swelling.     Patient's has hx/o hyperlipidemia is controlled with diet. Current  lipids are not at goal:  Lab Results  Component Value Date   CHOL 180 04/16/2019   HDL 36 (L) 04/16/2019   LDLCALC 113 (H) 04/16/2019   TRIG 192 (H) 04/16/2019   CHOLHDL 5.0 (H) 04/16/2019      Patient is monitored expectantly for glucose intolerance & prediabetes and patient denies reactive hypoglycemic symptoms, visual blurring, diabetic polys or paresthesias. Current A1c is Normal & at goal:  Lab Results  Component Value Date   HGBA1C 5.4 04/16/2019       Finally, patient has history of Vitamin D Deficiency ("28" / 2015) and current Vit D is very low:  Lab Results  Component Value Date   VD25OH 25 (L) 04/16/2019   Current Outpatient Medications on File Prior to Visit  Medication Sig   VITAMIN D PO Take by mouth. Takes occasionally   No current facility-administered medications on file prior to visit.    No Known Allergies   Past Medical History:  Diagnosis Date   Actinic keratitis    Anemia    BCC (basal cell carcinoma), ear    left ear   Elevated blood pressure reading without diagnosis of hypertension    Hypogonadism male    Nephrolithiasis 2006   Health Maintenance  Topic Date Due   COLONOSCOPY  08/13/2002   PNA vac Low Risk Adult (1 of 2 - PCV13) 08/13/2017   INFLUENZA VACCINE  01/19/2019   Hepatitis C  Screening  02/18/2020 (Originally 1950-08-27)   TETANUS/TDAP  08/18/2024   Immunization History  Administered Date(s) Administered   DTaP 05/20/2006   PPD Test 07/17/2013   Td 05/20/2006   Tdap 08/19/2014   Never had a Colon & is agreeable to have a Cologard test.  Past Surgical History:  Procedure Laterality Date   MOHS SURGERY Left 2014   ear BCC   SHOULDER SURGERY Left 1997   WRIST SURGERY Left 1997   Family History  Problem Relation Age of Onset   Hypertension Mother    Cancer Mother        metastatic melanoma   Hyperlipidemia Mother    Heart attack Father    Alcohol abuse Father    Heart disease Father    Hyperlipidemia Father    Cancer Father        throat   Diabetes Sister        juvenile   Heart attack Sister    Heart disease Sister    Hyperlipidemia Sister    Cancer Other        lymphoma   Heart disease Sister    Hyperlipidemia Sister    Social History     ROS Constitutional: Denies fever, chills, weight loss/gain, headaches, insomnia,  night sweats or change in appetite. Does c/o fatigue. Eyes: Denies redness, blurred vision, diplopia, discharge, itchy or  watery eyes.  ENT: Denies discharge, congestion, post nasal drip, epistaxis, sore throat, earache, hearing loss, dental pain, Tinnitus, Vertigo, Sinus pain or snoring.  Cardio: Denies chest pain, palpitations, irregular heartbeat, syncope, dyspnea, diaphoresis, orthopnea, PND, claudication or edema Respiratory: denies cough, dyspnea, DOE, pleurisy, hoarseness, laryngitis or wheezing.  Gastrointestinal: Denies dysphagia, heartburn, reflux, water brash, pain, cramps, nausea, vomiting, bloating, diarrhea, constipation, hematemesis, melena, hematochezia, jaundice or hemorrhoids Genitourinary: Denies dysuria, frequency, urgency, nocturia, hesitancy, discharge, hematuria or flank pain Musculoskeletal: Denies arthralgia, myalgia, stiffness, Jt. Swelling, pain, limp or strain/sprain. Denies  Falls. Skin: Denies puritis, rash, hives, warts, acne, eczema or change in skin lesion Neuro: No weakness, tremor, incoordination, spasms, paresthesia or pain Psychiatric: Denies confusion, memory loss or sensory loss. Denies Depression. Endocrine: Denies change in weight, skin, hair change, nocturia, and paresthesia, diabetic polys, visual blurring or hyper / hypo glycemic episodes.  Heme/Lymph: No excessive bleeding, bruising or enlarged lymph nodes.  Physical Exam  BP 136/82    Pulse 72    Temp (!) 97 F (36.1 C)    Resp 16    Ht 5\' 11"  (1.803 m)    Wt 195 lb 6.4 oz (88.6 kg)    BMI 27.25 kg/m   General Appearance: Well nourished and well groomed and in no apparent distress.  Eyes: PERRLA, EOMs, conjunctiva no swelling or erythema, normal fundi and vessels. Sinuses: No frontal/maxillary tenderness ENT/Mouth: EACs patent / TMs  nl. Nares clear without erythema, swelling, mucoid exudates. Oral hygiene is good. No erythema, swelling, or exudate. Tongue normal, non-obstructing. Tonsils not swollen or erythematous. Hearing normal.  Neck: Supple, thyroid not palpable. No bruits, nodes or JVD. Respiratory: Respiratory effort normal.  BS equal and clear bilateral without rales, rhonci, wheezing or stridor. Cardio: Heart sounds are normal with regular rate and rhythm and no murmurs, rubs or gallops. Peripheral pulses are normal and equal bilaterally without edema. No aortic or femoral bruits. Chest: symmetric with normal excursions and percussion.  Abdomen: Soft, with Nl bowel sounds. Nontender, no guarding, rebound, hernias, masses, or organomegaly.  Lymphatics: Non tender without lymphadenopathy.  Musculoskeletal: Full ROM all peripheral extremities, joint stability, 5/5 strength, and normal gait. Skin: Warm and dry without rashes, lesions, cyanosis, clubbing or  ecchymosis.  Neuro: Cranial nerves intact, reflexes equal bilaterally. Normal muscle tone, no cerebellar symptoms. Sensation intact.    Pysch: Alert and oriented X 3 with normal affect, insight and judgment appropriate.   Assessment and Plan  1. Annual Preventative/Screening Exam   2. Labile hypertension  - EKG 12-Lead - [place], RETROPERITNL ABD,  LTD - Microalbumin / creatinine urine ratio - CBC with Differential/Platelet - COMPLETE METABOLIC PANEL WITH GFR - Magnesium - TSH - Urinalysis, Routine w reflex microscopic  3. Hyperlipidemia, mixed  - EKG 12-Lead - [place], RETROPERITNL ABD,  LTD - Lipid panel - TSH  4. Abnormal glucose  - EKG 12-Lead - [place], RETROPERITNL ABD,  LTD - Insulin, random  5. Vitamin D deficiency  - VITAMIN D 25 Hydroxyl  6. BPH with obstruction/lower urinary tract symptoms  - PSA - Urinalysis, Routine w reflex microscopic  7. Screening for colorectal cancer  - POC Hemoccult Bld/Stl  8. Screening for ischemic heart disease  - EKG 12-Lead  9. FHx: heart disease  - EKG 12-Lead - [place], RETROPERITNL ABD,  LTD  10. Former smoker  - EKG 12-Lead - [place], RETROPERITNL ABD,  LTD  11. Screening for AAA (aortic abdominal aneurysm)  - [place], RETROPERITNL ABD,  LTD  12. Prostate  cancer screening  - PSA  13. Medication management  - Microalbumin / creatinine urine ratio - CBC with Differential/Platelet - COMPLETE METABOLIC PANEL WITH GFR - Magnesium - Lipid panel - TSH - Hemoglobin A1c - Insulin, random - VITAMIN D 25 Hydroxyl - Urinalysis, Routine w reflex microscopic           Patient was counseled in prudent diet, weight control to achieve/maintain BMI less than 25, BP monitoring, regular exercise and medications as discussed.  Discussed med effects and SE's. Routine screening labs and tests as requested with regular follow-up as recommended. Over 40 minutes of exam, counseling, chart review and high complex critical decision making was performed   Kirtland Bouchard, MD

## 2019-04-15 NOTE — Patient Instructions (Signed)

## 2019-04-16 ENCOUNTER — Other Ambulatory Visit: Payer: Self-pay

## 2019-04-16 ENCOUNTER — Ambulatory Visit (INDEPENDENT_AMBULATORY_CARE_PROVIDER_SITE_OTHER): Payer: PPO | Admitting: Internal Medicine

## 2019-04-16 VITALS — BP 136/82 | HR 72 | Temp 97.0°F | Resp 16 | Ht 71.0 in | Wt 195.4 lb

## 2019-04-16 DIAGNOSIS — Z79899 Other long term (current) drug therapy: Secondary | ICD-10-CM | POA: Diagnosis not present

## 2019-04-16 DIAGNOSIS — Z1211 Encounter for screening for malignant neoplasm of colon: Secondary | ICD-10-CM

## 2019-04-16 DIAGNOSIS — E559 Vitamin D deficiency, unspecified: Secondary | ICD-10-CM

## 2019-04-16 DIAGNOSIS — Z87891 Personal history of nicotine dependence: Secondary | ICD-10-CM | POA: Diagnosis not present

## 2019-04-16 DIAGNOSIS — E782 Mixed hyperlipidemia: Secondary | ICD-10-CM

## 2019-04-16 DIAGNOSIS — R0989 Other specified symptoms and signs involving the circulatory and respiratory systems: Secondary | ICD-10-CM

## 2019-04-16 DIAGNOSIS — N138 Other obstructive and reflux uropathy: Secondary | ICD-10-CM

## 2019-04-16 DIAGNOSIS — N401 Enlarged prostate with lower urinary tract symptoms: Secondary | ICD-10-CM

## 2019-04-16 DIAGNOSIS — Z0001 Encounter for general adult medical examination with abnormal findings: Secondary | ICD-10-CM

## 2019-04-16 DIAGNOSIS — Z0000 Encounter for general adult medical examination without abnormal findings: Secondary | ICD-10-CM | POA: Diagnosis not present

## 2019-04-16 DIAGNOSIS — R7309 Other abnormal glucose: Secondary | ICD-10-CM

## 2019-04-16 DIAGNOSIS — Z8249 Family history of ischemic heart disease and other diseases of the circulatory system: Secondary | ICD-10-CM | POA: Diagnosis not present

## 2019-04-16 DIAGNOSIS — Z125 Encounter for screening for malignant neoplasm of prostate: Secondary | ICD-10-CM

## 2019-04-16 DIAGNOSIS — I10 Essential (primary) hypertension: Secondary | ICD-10-CM

## 2019-04-16 DIAGNOSIS — Z136 Encounter for screening for cardiovascular disorders: Secondary | ICD-10-CM

## 2019-04-17 LAB — LIPID PANEL
Cholesterol: 180 mg/dL (ref <200)
HDL: 36 mg/dL — ABNORMAL LOW (ref >=40)
LDL Cholesterol (Calc): 113 mg/dL (calc) — ABNORMAL HIGH
Non-HDL Cholesterol (Calc): 144 mg/dL (calc) — ABNORMAL HIGH (ref <130)
Total CHOL/HDL Ratio: 5 (calc) — ABNORMAL HIGH (ref <5.0)
Triglycerides: 192 mg/dL — ABNORMAL HIGH (ref <150)

## 2019-04-17 LAB — HEMOGLOBIN A1C
Hgb A1c MFr Bld: 5.4 % of total Hgb (ref <5.7)
Mean Plasma Glucose: 108 (calc)
eAG (mmol/L): 6 (calc)

## 2019-04-17 LAB — URINALYSIS, ROUTINE W REFLEX MICROSCOPIC
Bacteria, UA: NONE SEEN /HPF
Bilirubin Urine: NEGATIVE
Glucose, UA: NEGATIVE
Hyaline Cast: NONE SEEN /LPF
Ketones, ur: NEGATIVE
Leukocytes,Ua: NEGATIVE
Nitrite: NEGATIVE
Protein, ur: NEGATIVE
RBC / HPF: NONE SEEN /HPF (ref 0–2)
Specific Gravity, Urine: 1.013 (ref 1.001–1.03)
Squamous Epithelial / HPF: NONE SEEN /HPF (ref <=5)
WBC, UA: NONE SEEN /HPF (ref 0–5)
pH: 7.5 (ref 5.0–8.0)

## 2019-04-17 LAB — COMPLETE METABOLIC PANEL WITH GFR
AG Ratio: 1.8 (calc) (ref 1.0–2.5)
ALT: 30 U/L (ref 9–46)
AST: 22 U/L (ref 10–35)
Albumin: 4.5 g/dL (ref 3.6–5.1)
Alkaline phosphatase (APISO): 81 U/L (ref 35–144)
BUN: 9 mg/dL (ref 7–25)
CO2: 24 mmol/L (ref 20–32)
Calcium: 9.9 mg/dL (ref 8.6–10.3)
Chloride: 103 mmol/L (ref 98–110)
Creat: 1 mg/dL (ref 0.70–1.25)
GFR, Est African American: 90 mL/min/{1.73_m2} (ref >=60)
GFR, Est Non African American: 78 mL/min/{1.73_m2} (ref >=60)
Globulin: 2.5 g/dL (calc) (ref 1.9–3.7)
Glucose, Bld: 117 mg/dL — ABNORMAL HIGH (ref 65–99)
Potassium: 4.2 mmol/L (ref 3.5–5.3)
Sodium: 139 mmol/L (ref 135–146)
Total Bilirubin: 0.5 mg/dL (ref 0.2–1.2)
Total Protein: 7 g/dL (ref 6.1–8.1)

## 2019-04-17 LAB — PSA: PSA: 0.6 ng/mL (ref <=4.0)

## 2019-04-17 LAB — CBC WITH DIFFERENTIAL/PLATELET
Absolute Monocytes: 582 cells/uL (ref 200–950)
Basophils Absolute: 90 cells/uL (ref 0–200)
Basophils Relative: 1.1 %
Eosinophils Absolute: 230 cells/uL (ref 15–500)
Eosinophils Relative: 2.8 %
HCT: 49.1 % (ref 38.5–50.0)
Hemoglobin: 16.7 g/dL (ref 13.2–17.1)
Lymphs Abs: 2435 cells/uL (ref 850–3900)
MCH: 29.1 pg (ref 27.0–33.0)
MCHC: 34 g/dL (ref 32.0–36.0)
MCV: 85.5 fL (ref 80.0–100.0)
MPV: 9.8 fL (ref 7.5–12.5)
Monocytes Relative: 7.1 %
Neutro Abs: 4863 cells/uL (ref 1500–7800)
Neutrophils Relative %: 59.3 %
Platelets: 301 10*3/uL (ref 140–400)
RBC: 5.74 10*6/uL (ref 4.20–5.80)
RDW: 13.5 % (ref 11.0–15.0)
Total Lymphocyte: 29.7 %
WBC: 8.2 10*3/uL (ref 3.8–10.8)

## 2019-04-17 LAB — MICROALBUMIN / CREATININE URINE RATIO
Creatinine, Urine: 87 mg/dL (ref 20–320)
Microalb Creat Ratio: 5 mcg/mg creat (ref <30)
Microalb, Ur: 0.4 mg/dL

## 2019-04-17 LAB — TSH: TSH: 0.86 mIU/L (ref 0.40–4.50)

## 2019-04-17 LAB — MAGNESIUM: Magnesium: 2.2 mg/dL (ref 1.5–2.5)

## 2019-04-17 LAB — VITAMIN D 25 HYDROXY (VIT D DEFICIENCY, FRACTURES): Vit D, 25-Hydroxy: 25 ng/mL — ABNORMAL LOW (ref 30–100)

## 2019-04-17 LAB — INSULIN, RANDOM: Insulin: 79.5 u[IU]/mL — ABNORMAL HIGH

## 2019-04-20 ENCOUNTER — Encounter: Payer: Self-pay | Admitting: Internal Medicine

## 2019-08-15 ENCOUNTER — Ambulatory Visit: Payer: PPO | Attending: Internal Medicine

## 2019-08-15 DIAGNOSIS — Z23 Encounter for immunization: Secondary | ICD-10-CM | POA: Insufficient documentation

## 2019-08-15 NOTE — Progress Notes (Signed)
   Covid-19 Vaccination Clinic  Name:  Adam Hammond    MRN: [id number] DOB: [birth year]  08/15/2019  Mr. [name] was observed post Covid-19 immunization for 15 minutes without incidence. He was provided with Vaccine Information Sheet and instruction to access the V-Safe system.   Mr. [name] was instructed to call 911 with any severe reactions post vaccine: Marland Kitchen Difficulty breathing  . Swelling of your face and throat  . A fast heartbeat  . A bad rash all over your body  . Dizziness and weakness    Immunizations Administered    Name Date Dose VIS Date Route   Pfizer COVID-19 Vaccine 08/15/2019  2:08 PM 0.3 mL 05/31/2019 Intramuscular   Manufacturer: [place]   Lot: [id number]   [place]: [id number]

## 2019-09-03 ENCOUNTER — Ambulatory Visit (INDEPENDENT_AMBULATORY_CARE_PROVIDER_SITE_OTHER): Payer: PPO | Admitting: Adult Health Nurse Practitioner

## 2019-09-03 ENCOUNTER — Other Ambulatory Visit: Payer: Self-pay

## 2019-09-03 ENCOUNTER — Ambulatory Visit: Payer: PPO | Admitting: Adult Health

## 2019-09-03 ENCOUNTER — Encounter: Payer: Self-pay | Admitting: Adult Health Nurse Practitioner

## 2019-09-03 VITALS — BP 132/84 | HR 66 | Temp 97.9°F | Ht 71.0 in | Wt 196.6 lb

## 2019-09-03 DIAGNOSIS — E559 Vitamin D deficiency, unspecified: Secondary | ICD-10-CM | POA: Diagnosis not present

## 2019-09-03 DIAGNOSIS — E291 Testicular hypofunction: Secondary | ICD-10-CM

## 2019-09-03 DIAGNOSIS — Z79899 Other long term (current) drug therapy: Secondary | ICD-10-CM

## 2019-09-03 DIAGNOSIS — Z0001 Encounter for general adult medical examination with abnormal findings: Secondary | ICD-10-CM

## 2019-09-03 DIAGNOSIS — Z1159 Encounter for screening for other viral diseases: Secondary | ICD-10-CM | POA: Diagnosis not present

## 2019-09-03 DIAGNOSIS — R7309 Other abnormal glucose: Secondary | ICD-10-CM | POA: Diagnosis not present

## 2019-09-03 DIAGNOSIS — R6889 Other general symptoms and signs: Secondary | ICD-10-CM

## 2019-09-03 DIAGNOSIS — R030 Elevated blood-pressure reading, without diagnosis of hypertension: Secondary | ICD-10-CM | POA: Diagnosis not present

## 2019-09-03 DIAGNOSIS — D649 Anemia, unspecified: Secondary | ICD-10-CM

## 2019-09-03 DIAGNOSIS — N138 Other obstructive and reflux uropathy: Secondary | ICD-10-CM

## 2019-09-03 DIAGNOSIS — N200 Calculus of kidney: Secondary | ICD-10-CM | POA: Diagnosis not present

## 2019-09-03 DIAGNOSIS — E782 Mixed hyperlipidemia: Secondary | ICD-10-CM

## 2019-09-03 DIAGNOSIS — Z6827 Body mass index (BMI) 27.0-27.9, adult: Secondary | ICD-10-CM

## 2019-09-03 DIAGNOSIS — Z0000 Encounter for general adult medical examination without abnormal findings: Secondary | ICD-10-CM

## 2019-09-03 DIAGNOSIS — N401 Enlarged prostate with lower urinary tract symptoms: Secondary | ICD-10-CM

## 2019-09-03 NOTE — Progress Notes (Addendum)
MEDICARE ANNUAL WELLNESS VISIT AND 6 MONTH FOLLOW UP Assessment:   Diagnoses and all orders for this visit:  Medicare annual wellness visit, subsequent Yearly   Elevated blood pressure reading without diagnosis of hypertension Discussed dietary and exercise modifications Discussed DASH diet.   Reminder to go to the ER if any CP, SOB, nausea, dizziness, severe HA, changes vision/speech, left arm numbness and tingling and jaw pain.  Hyperlipademia Controlled with diet and exercise  Hypogonadism male Asymptomatic  Nephrolithiasis History of Asymptomatic Continue to monitor Increase fluid intake  BPH with LUTS Doing well at this time  No medications Continue to monitor  Anemia, unspecified type Asymptomatic Will check labs next office visit  Vitamin D deficiency Discussed supplementation Will check labs next office visit  BMI 27.0-27.9 Discussed dietary and exercise modifications  Medication Management Continued  Need for Hepatitis C screening Discussed with patient Compllted today  Patient to follow up in 3-4 months for follow up or sooner if needed.  I provided 30 minutes of  face-to-face time during this encounter including counseling, chart review, and critical decision making was preformed.   Future Appointments  Date Time Provider [place]  09/10/2019  2:00 PM [place] [facility]  05/05/2020  2:00 PM Unk Pinto, MD [id number] None      Plan:   During the course of the visit the patient was educated and counseled about appropriate screening and preventive services including:    Pneumococcal vaccine   Influenza vaccine  Prevnar 13  Td vaccine  Colorectal cancer screening  Diabetes screening  Glaucoma screening  Nutrition counseling    Subjective:  Adam Hammond is a 69 y.o. male who presents for Medicare Annual Wellness Visit.  He has history of elevated blood pressure without diagnosis of  HTN, nephrolithiasis, hypogonadism, anemia and low back pain.  His last OV was 04/15/20 with Dr Melford Aase.  He was treated with a round of meloxicam and muscle relaxants prn.  Plan for imaging if no improvement.  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    Today he denies any low back pain. He is managing well does not take any medications.  He is not prevented from his daily activities.  He has no health concerns today and does not take any regular medications.  He does exercise 7 days a week by walking his dog  He does this for 83min every day.   He does not have blood pressure or vitals to report today. He does workout by means of walking his dogs and yard work. He denies chest pain, shortness of breath, dizziness.  He is not on cholesterol medication and denies myalgias. His cholesterol is not at goal but last check was over three years ago. The cholesterol last visit was:   Lab Results  Component Value Date   CHOL 155 09/03/2019   HDL 31 (L) 09/03/2019   LDLCALC 97 09/03/2019   TRIG 172 (H) 09/03/2019   CHOLHDL 5.0 (H) 09/03/2019   He has not been working on diet and exercise for prediabetes, and denies hyperglycemia, hypoglycemia , increased appetite, nausea, paresthesia of the feet, polydipsia, polyuria, visual disturbances, vomiting and weight loss. Last A1C in the office was:  Lab Results  Component Value Date   HGBA1C 5.4 04/16/2019   Last GFR Lab Results  Component Value Date   GFRNONAA 78 04/16/2019     Lab Results  Component Value Date   GFRAA 90 04/16/2019  Patient is on not Vitamin D supplement.   Lab Results  Component Value Date   VD25OH 25 (L) 04/16/2019     He has a history of testosterone deficiency and is not on testosterone replacement. He denies decrease in energy, libido, or muscle mass.  He is not on testosterone replacement. Lab Results  Component Value Date   TESTOSTERONE 276 (L) 08/19/2014   Medication  Review:       Current Outpatient Medications (Other):  Marland Kitchen  VITAMIN D PO, Take by mouth. Takes occasionally  Allergies: No Known Allergies  Current Problems (verified) has Nephrolithiasis; BCC (basal cell carcinoma), ear; Elevated BP without diagnosis of hypertension; Hypogonadism male; Labile hypertension; Abnormal glucose; Vitamin D deficiency; Screening for colorectal cancer; and BPH with obstruction/lower urinary tract symptoms on their problem list.  Screening Tests Immunization History  Administered Date(s) Administered  . DTaP 05/20/2006  . PFIZER SARS-COV-2 Vaccination 08/15/2019  . PPD Test 07/17/2013  . Td 05/20/2006  . Tdap 08/19/2014    Preventative care: Last colonoscopy: Continues to decline  Prior vaccinations: TD or Tdap: 2016  Influenza: Declines  Pneumococcal: Declines Prevnar13: Declines Shingles/Zostavax: Declines Shingrix, DUE COVID- Next week second shot.    Names of Other Physician/Practitioners you currently use: 1. [facility] here for primary care 2. Eye Exam, DUE for 2021 3. Dental Exam: DUE for 2021   CXR 2014 DERM: 2016, WNL  Patient Care Team: Unk Pinto, MD as PCP - General (Internal Medicine)  Surgical: He  has a past surgical history that includes Shoulder surgery (Left, 1997); Wrist surgery (Left, 1997); and Mohs surgery (Left, 2014). Family His family history includes Alcohol abuse in his father; Cancer in his father, mother, and another family member; Diabetes in his sister; Heart attack in his father and sister; Heart disease in his father, sister, and sister; Hyperlipidemia in his father, mother, sister, and sister; Hypertension in his mother. Social history  He reports that he quit smoking about 7 years ago. He has never used smokeless tobacco. He reports that he does not use drugs. No history on file for alcohol.  MEDICARE WELLNESS OBJECTIVES: Physical activity: Current Exercise  Habits: Home exercise routine, Type of exercise: walking, Time (Minutes): 30, Frequency (Times/Week): 7, Weekly Exercise (Minutes/Week): 210, Intensity: Mild, Exercise limited by: None identified Cardiac risk factors: Cardiac Risk Factors include: advanced age (>78men, >33 women);male gender Depression/mood screen:   Depression screen [facility] 2/9 09/03/2019  Decreased Interest 0  Down, Depressed, Hopeless 0  PHQ - 2 Score 0    ADLs:  In your present state of health, do you have any difficulty performing the following activities: 09/03/2019 04/20/2019  Hearing? N N  Vision? N N  Difficulty concentrating or making decisions? N N  Walking or climbing stairs? N N  Dressing or bathing? N N  Doing errands, shopping? N N  Preparing Food and eating ? N -  Using the Toilet? N -  In the past six months, have you accidently leaked urine? N -  Do you have problems with loss of bowel control? N -  Managing your Medications? N -  Managing your Finances? N -  Housekeeping or managing your Housekeeping? N -  Some recent data might be hidden     Cognitive Testing  Alert? Yes  Normal Appearance?Yes  Oriented to person? Yes  Place? Yes   Time? Yes  Recall of three objects?  Yes  Can perform simple calculations? Yes  Displays appropriate judgment?Yes  Can read the correct time from a watch face?Yes  EOL planning: Does Patient Have a Medical Advance Directive?: No Would patient like information on creating a medical advance directive?: No - Patient declined   Objective:   Today's Vitals   09/03/19 1403  BP: 132/84  Pulse: 66  Temp: 97.9 F (36.6 C)  SpO2: 97%  Weight: 196 lb 9.6 oz (89.2 kg)  Height: 5\' 11"  (1.803 m)   Body mass index is 27.42 kg/m.  General : Well sounding patient in no apparent distress HEENT: no hoarseness, no cough for duration of visit Lungs: speaks in complete sentences, no audible wheezing, no apparent distress Neurological: alert, oriented x 3 Psychiatric:  pleasant, judgement appropriate   Medicare Attestation I have personally reviewed: The patient's medical and social history Their use of alcohol, tobacco or illicit drugs Their current medications and supplements The patient's functional ability including ADLs,fall risks, home safety risks, cognitive, and hearing and visual impairment Diet and physical activities Evidence for depression or mood disorders  The patient's weight, height, BMI, and visual acuity have been recorded in the chart.  I have made referrals, counseling, and provided education to the patient based on review of the above and I have provided the patient with a written personalized care plan for preventive services.     Garnet Sierras, NP [facility] 09/04/2019  5:26 PM

## 2019-09-03 NOTE — Patient Instructions (Addendum)
Adam Hammond , Thank you for taking time to come for your Medicare Wellness Visit. I appreciate your ongoing commitment to your health goals. Please review the following plan we discussed and let me know if I can assist you in the future.   You are do for Eye Exam, Dentist, Pneumonia vaccination, Colonoscopy or Cologuard.  Here are some references for Eye Exam:  Dr Gershon Crane eye exam  [facility]     This is a list of the screening recommended for you and due dates:  Health Maintenance  Topic Date Due  . Colon Cancer Screening  Never done  . Pneumonia vaccines (1 of 2 - PCV13) Never done  . Flu Shot  Never done  .  Hepatitis C: One time screening is recommended by Center for Disease Control  (CDC) for  adults born from [birth year] through 1963.   02/18/2020*  . Tetanus Vaccine  08/18/2024  *Topic was postponed. The date shown is not the original due date.       Vit D  & Vit C 1,000 mg   are recommended to help protect  against the Covid-19 and other Corona viruses.    Also it's recommended  to take  Zinc 50 mg  to help  protect against the Covid-19   and best place to get  is also on [email or web address]  and don't pay more than 6-8 cents /pill !  ================================ Coronavirus (COVID-19) Are you at risk?  Are you at risk for the Coronavirus (COVID-19)?  To be considered HIGH RISK for Coronavirus (COVID-19), you have to meet the following criteria:  . Traveled to [place], [place], [place], [place] or [place]; or in the [place] to [place], [place], [place]  . or [place]; and have fever, cough, and shortness of breath within the last 2 weeks of travel OR . Been in close contact with a person diagnosed with COVID-19 within the last 2 weeks and have  . fever, cough,and shortness of breath .  . IF YOU DO NOT MEET THESE CRITERIA, YOU ARE CONSIDERED LOW RISK FOR COVID-19.  What to do if you are HIGH RISK for  COVID-19?  Marland Kitchen If you are having a medical emergency, call 911. . Seek medical care right away. Before you go to a doctor's office, urgent care or emergency department, .  call ahead and tell them about your recent travel, contact with someone diagnosed with COVID-19  .  and your symptoms.  . You should receive instructions from your physician's office regarding next steps of care.  . When you arrive at healthcare provider, tell the healthcare staff immediately you have returned from  . visiting [place], [place], [place], [place] or [place]; or traveled in the [place] to [place], [place],  . [place] or [place] in the last two weeks or you have been in close contact with a person diagnosed with  . COVID-19 in the last 2 weeks.   . Tell the health care staff about your symptoms: fever, cough and shortness of breath. . After you have been seen by a medical provider, you will be either: o Tested for (COVID-19) and discharged home on quarantine except to seek medical care if  o symptoms worsen, and asked to  - Stay home and avoid contact with others until you get your results (4-5 days)  - Avoid travel on public transportation if possible (such as bus, train, or airplane) or o  Sent to the Emergency Department by EMS for evaluation, COVID-19 testing  and  o possible admission depending on your condition and test results.  What to do if you are LOW RISK for COVID-19?  Reduce your risk of any infection by using the same precautions used for avoiding the common cold or flu:  Marland Kitchen Wash your hands often with soap and warm water for at least 20 seconds.  If soap and water are not readily available,  . use an alcohol-based hand sanitizer with at least 60% alcohol.  . If coughing or sneezing, cover your mouth and nose by coughing or sneezing into the elbow areas of your shirt or coat, .  into a tissue or into your sleeve (not your hands). . Avoid shaking hands with others and consider head nods  or verbal greetings only. . Avoid touching your eyes, nose, or mouth with unwashed hands.  . Avoid close contact with people who are sick. . Avoid places or events with large numbers of people in one location, like concerts or sporting events. . Carefully consider travel plans you have or are making. . If you are planning any travel outside or inside the [place], visit the CDC's Travelers' Health webpage for the latest health notices. . If you have some symptoms but not all symptoms, continue to monitor at home and seek medical attention  . if your symptoms worsen. . If you are having a medical emergency, call 911. >>>>>>>>>>>>>>>>>>>>>>>>>>>>>>>>>>>>>>>>>>>>>>>>>>>>>>> We Do NOT Approve of  [facility] [place] Soliciting Our Patients  To Do Home Visits  & We Do NOT Approve of [employer] > > > > > > > > > > > > > > > > > > > > > > > > > > > > > > > > > > >  > > > >   Preventive Care for Adults  A healthy lifestyle and preventive care can promote health and wellness. Preventive health guidelines for men include the following key practices:  A routine yearly physical is a good way to check with your health care provider about your health and preventative screening. It is a chance to share any concerns and updates on your health and to receive a thorough exam.  Visit your dentist for a routine exam and preventative care every 6 months. Brush your teeth twice a day and floss once a day. Good oral hygiene prevents tooth decay and gum disease.  The frequency of eye exams is based on your age, health, family medical history, use of contact lenses, and other factors. Follow your health care provider's recommendations for frequency of eye exams.  Eat a healthy diet. Foods such as vegetables, fruits, whole grains, low-fat dairy products, and lean protein foods contain the nutrients you need without too many calories. Decrease your intake of foods high in solid fats, added sugars,  and salt. Eat the right amount of calories for you. Get information about a proper diet from your health care provider, if necessary.  Regular physical exercise is one of the most important things you can do for your health. Most adults should get at least 150 minutes of moderate-intensity exercise (any activity that increases your heart rate and causes you to sweat) each week. In addition, most adults need muscle-strengthening exercises on 2 or more days a week.  Maintain a healthy weight. The body mass index (BMI) is a screening tool to identify possible weight problems. It provides an estimate of body fat based on  height and weight. Your health care provider can find your BMI and can help you achieve or maintain a healthy weight. For adults 20 years and older:  A BMI below 18.5 is considered underweight.  A BMI of 18.5 to 24.9 is normal.  A BMI of 25 to 29.9 is considered overweight.  A BMI of 30 and above is considered obese.  Maintain normal blood lipids and cholesterol levels by exercising and minimizing your intake of saturated fat. Eat a balanced diet with plenty of fruit and vegetables. Blood tests for lipids and cholesterol should begin at age 46 and be repeated every 5 years. If your lipid or cholesterol levels are high, you are over 50, or you are at high risk for heart disease, you may need your cholesterol levels checked more frequently. Ongoing high lipid and cholesterol levels should be treated with medicines if diet and exercise are not working.  If you smoke, find out from your health care provider how to quit. If you do not use tobacco, do not start.  Lung cancer screening is recommended for adults aged 27-80 years who are at high risk for developing lung cancer because of a history of smoking. A yearly low-dose CT scan of the lungs is recommended for people who have at least a 30-pack-year history of smoking and are a current smoker or have quit within the past 15 years. A pack  year of smoking is smoking an average of 1 pack of cigarettes a day for 1 year (for example: 1 pack a day for 30 years or 2 packs a day for 15 years). Yearly screening should continue until the smoker has stopped smoking for at least 15 years. Yearly screening should be stopped for people who develop a health problem that would prevent them from having lung cancer treatment.  If you choose to drink alcohol, do not have more than 2 drinks per day. One drink is considered to be 12 ounces (355 mL) of beer, 5 ounces (148 mL) of wine, or 1.5 ounces (44 mL) of liquor.  Avoid use of street drugs. Do not share needles with anyone. Ask for help if you need support or instructions about stopping the use of drugs.  High blood pressure causes heart disease and increases the risk of stroke. Your blood pressure should be checked at least every 1-2 years. Ongoing high blood pressure should be treated with medicines, if weight loss and exercise are not effective.  If you are 42-61 years old, ask your health care provider if you should take aspirin to prevent heart disease.  Diabetes screening involves taking a blood sample to check your fasting blood sugar level. Testing should be considered at a younger age or be carried out more frequently if you are overweight and have at least 1 risk factor for diabetes.  Colorectal cancer can be detected and often prevented. Most routine colorectal cancer screening begins at the age of 38 and continues through age 26. However, your health care provider may recommend screening at an earlier age if you have risk factors for colon cancer. On a yearly basis, your health care provider may provide home test kits to check for hidden blood in the stool. Use of a small camera at the end of a tube to directly examine the colon (sigmoidoscopy or colonoscopy) can detect the earliest forms of colorectal cancer. Talk to your health care provider about this at age 45, when routine screening  begins. Direct exam of the colon should be  repeated every 5-10 years through age 40, unless early forms of precancerous polyps or small growths are found.  Hepatitis C blood testing is recommended for all people born from [birth year] through 1963 and any individual with known risks for hepatitis C.  Screening for abdominal aortic aneurysm (AAA)  by ultrasound is recommended for people who have history of high blood pressure or who are current or former smokers.  Healthy men should  receive prostate-specific antigen (PSA) blood tests as part of routine cancer screening. Talk with your health care provider about prostate cancer screening.  Testicular cancer screening is  recommended for adult males. Screening includes self-exam, a health care provider exam, and other screening tests. Consult with your health care provider about any symptoms you have or any concerns you have about testicular cancer.  Use sunscreen. Apply sunscreen liberally and repeatedly throughout the day. You should seek shade when your shadow is shorter than you. Protect yourself by wearing long sleeves, pants, a wide-brimmed hat, and sunglasses year round, whenever you are outdoors.  Once a month, do a whole-body skin exam, using a mirror to look at the skin on your back. Tell your health care provider about new moles, moles that have irregular borders, moles that are larger than a pencil eraser, or moles that have changed in shape or color.  Stay current with required vaccines (immunizations).  Influenza vaccine. All adults should be immunized every year.  Tetanus, diphtheria, and acellular pertussis (Td, Tdap) vaccine. An adult who has not previously received Tdap or who does not know his vaccine status should receive 1 dose of Tdap. This initial dose should be followed by tetanus and diphtheria toxoids (Td) booster doses every 10 years. Adults with an unknown or incomplete history of completing a 3-dose immunization series with  Td-containing vaccines should begin or complete a primary immunization series including a Tdap dose. Adults should receive a Td booster every 10 years.  Zoster vaccine. One dose is recommended for adults aged 20 years or older unless certain conditions are present.    PREVNAR - Pneumococcal 13-valent conjugate (PCV13) vaccine. When indicated, a person who is uncertain of his immunization history and has no record of immunization should receive the PCV13 vaccine. An adult aged 31 years or older who has certain medical conditions and has not been previously immunized should receive 1 dose of PCV13 vaccine. This PCV13 should be followed with a dose of pneumococcal polysaccharide (PPSV23) vaccine. The PPSV23 vaccine dose should be obtained 1 or more year(s)after the dose of PCV13 vaccine. An adult aged 73 years or older who has certain medical conditions and previously received 1 or more doses of PPSV23 vaccine should receive 1 dose of PCV13. The PCV13 vaccine dose should be obtained 1 or more years after the last PPSV23 vaccine dose.    PNEUMOVAX - Pneumococcal polysaccharide (PPSV23) vaccine. When PCV13 is also indicated, PCV13 should be obtained first. All adults aged 16 years and older should be immunized. An adult younger than age 35 years who has certain medical conditions should be immunized. Any person who resides in a nursing home or long-term care facility should be immunized. An adult smoker should be immunized. People with an immunocompromised condition and certain other conditions should receive both PCV13 and PPSV23 vaccines. People with human immunodeficiency virus (HIV) infection should be immunized as soon as possible after diagnosis. Immunization during chemotherapy or radiation therapy should be avoided. Routine use of PPSV23 vaccine is not recommended for American Indians, [place] Natives,  or people younger than 65 years unless there are medical conditions that require PPSV23 vaccine. When  indicated, people who have unknown immunization and have no record of immunization should receive PPSV23 vaccine. One-time revaccination 5 years after the first dose of PPSV23 is recommended for people aged 19-64 years who have chronic kidney failure, nephrotic syndrome, asplenia, or immunocompromised conditions. People who received 1-2 doses of PPSV23 before age 44 years should receive another dose of PPSV23 vaccine at age 36 years or later if at least 5 years have passed since the previous dose. Doses of PPSV23 are not needed for people immunized with PPSV23 at or after age 68 years.    Hepatitis A vaccine. Adults who wish to be protected from this disease, have certain high-risk conditions, work with hepatitis A-infected animals, work in hepatitis A research labs, or travel to or work in countries with a high rate of hepatitis A should be immunized. Adults who were previously unvaccinated and who anticipate close contact with an international adoptee during the first 60 days after arrival in the [place] from a country with a high rate of hepatitis A should be immunized.    Hepatitis B vaccine. Adults should be immunized if they wish to be protected from this disease, have certain high-risk conditions, may be exposed to blood or other infectious body fluids, are household contacts or sex partners of hepatitis B positive people, are clients or workers in certain care facilities, or travel to or work in countries with a high rate of hepatitis B.   Preventive Service / Frequency   Ages 72 and over  Blood pressure check.  Lipid and cholesterol check.  Lung cancer screening. / Every year if you are aged 81-80 years and have a 30-pack-year history of smoking and currently smoke or have quit within the past 15 years. Yearly screening is stopped once you have quit smoking for at least 15 years or develop a health problem that would prevent you from having lung cancer treatment.  Fecal occult  blood test (FOBT) of stool. You may not have to do this test if you get a colonoscopy every 10 years.  Flexible sigmoidoscopy** or colonoscopy.** / Every 5 years for a flexible sigmoidoscopy or every 10 years for a colonoscopy beginning at age 57 and continuing until age 52.  Hepatitis C blood test.** / For all people born from [birth year] through 1963 and any individual with known risks for hepatitis C.  Abdominal aortic aneurysm (AAA) screening./ Screening current or former smokers or have Hypertension.  Skin self-exam. / Monthly.  Influenza vaccine. / Every year.  Tetanus, diphtheria, and acellular pertussis (Tdap/Td) vaccine.** / 1 dose of Td every 10 years.   Zoster vaccine.** / 1 dose for adults aged 70 years or older.         Pneumococcal 13-valent conjugate (PCV13) vaccine.    Pneumococcal polysaccharide (PPSV23) vaccine.     Hepatitis A vaccine.** / Consult your health care provider.  Hepatitis B vaccine.** / Consult your health care provider. Screening for abdominal aortic aneurysm (AAA)  by ultrasound is recommended for people who have history of high blood pressure or who are current or former smokers. ++++++++++ Recommend Adult Low Dose Aspirin or  coated  Aspirin 81 mg daily  To reduce risk of Colon Cancer 40 %,  Skin Cancer 26 % ,  Malignant Melanoma 46%  and  Pancreatic cancer 60% ++++++++++++++++++++++ Vitamin D goal  is between 70-100.  Please make sure that you are  taking your Vitamin D as directed.  It is very important as a natural anti-inflammatory  helping hair, skin, and nails, as well as reducing stroke and heart attack risk.  It helps your bones and helps with mood. It also decreases numerous cancer risks so please take it as directed.  Low Vit D is associated with a 200-300% higher risk for CANCER  and 200-300% higher risk for HEART   ATTACK  &  STROKE.   .....................................Marland Kitchen It is also associated with higher death rate at younger  ages,  autoimmune diseases like Rheumatoid arthritis, Lupus, Multiple Sclerosis.    Also many other serious conditions, like depression, Alzheimer's Dementia, infertility, muscle aches, fatigue, fibromyalgia - just to name a few. ++++++++++++++++++++++ Recommend the book "The END of DIETING" by Dr Excell Seltzer  & the book "The END of DIABETES " by Dr Excell Seltzer At [email or web address] - get book & Audio CD's    Being diabetic has a  300% increased risk for heart attack, stroke, cancer, and alzheimer- type vascular dementia. It is very important that you work harder with diet by avoiding all foods that are white. Avoid white rice (brown & wild rice is OK), white potatoes (sweetpotatoes in moderation is OK), White bread or wheat bread or anything made out of white flour like bagels, donuts, rolls, buns, biscuits, cakes, pastries, cookies, pizza crust, and pasta (made from white flour & egg whites) - vegetarian pasta or spinach or wheat pasta is OK. Multigrain breads like Arnold's or [employer], or multigrain sandwich thins or flatbreads.  Diet, exercise and weight loss can reverse and cure diabetes in the early stages.  Diet, exercise and weight loss is very important in the control and prevention of complications of diabetes which affects every system in your body, ie. Brain - dementia/stroke, eyes - glaucoma/blindness, heart - heart attack/heart failure, kidneys - dialysis, stomach - gastric paralysis, intestines - malabsorption, nerves - severe painful neuritis, circulation - gangrene & loss of a leg(s), and finally cancer and Alzheimers.    I recommend avoid fried & greasy foods,  sweets/candy, white rice (brown or wild rice or Quinoa is OK), white potatoes (sweet potatoes are OK) - anything made from white flour - bagels, doughnuts, rolls, buns, biscuits,white and wheat breads, pizza crust and traditional pasta made of white flour & egg white(vegetarian pasta or spinach or wheat pasta is OK).  Multi-grain  bread is OK - like multi-grain flat bread or sandwich thins. Avoid alcohol in excess. Exercise is also important.    Eat all the vegetables you want - avoid meat, especially red meat and dairy - especially cheese.  Cheese is the most concentrated form of trans-fats which is the worst thing to clog up our arteries. Veggie cheese is OK which can be found in the fresh produce section at [employer] or [employer] or [employer]  ++++++++++++++++++++++ DASH Eating Plan  DASH stands for "Dietary Approaches to Stop Hypertension."   The DASH eating plan is a healthy eating plan that has been shown to reduce high blood pressure (hypertension). Additional health benefits may include reducing the risk of type 2 diabetes mellitus, heart disease, and stroke. The DASH eating plan may also help with weight loss. WHAT DO I NEED TO KNOW ABOUT THE DASH EATING PLAN? For the DASH eating plan, you will follow these general guidelines:  Choose foods with a percent daily value for sodium of less than 5% (as listed on the food label).  Use salt-free seasonings or  herbs instead of table salt or sea salt.  Check with your health care provider or pharmacist before using salt substitutes.  Eat lower-sodium products, often labeled as "lower sodium" or "no salt added."  Eat fresh foods.  Eat more vegetables, fruits, and low-fat dairy products.  Choose whole grains. Look for the word "whole" as the first word in the ingredient list.  Choose fish   Limit sweets, desserts, sugars, and sugary drinks.  Choose heart-healthy fats.  Eat veggie cheese   Eat more home-cooked food and less restaurant, buffet, and fast food.  Limit fried foods.  Cook foods using methods other than frying.  Limit canned vegetables. If you do use them, rinse them well to decrease the sodium.  When eating at a restaurant, ask that your food be prepared with less salt, or no salt if possible.                      WHAT FOODS CAN I  EAT? Read Dr Fara Olden Fuhrman's books on The End of Dieting & The End of Diabetes  Grains Whole grain or whole wheat bread. Brown rice. Whole grain or whole wheat pasta. Quinoa, bulgur, and whole grain cereals. Low-sodium cereals. Corn or whole wheat flour tortillas. Whole grain cornbread. Whole grain crackers. Low-sodium crackers.  Vegetables Fresh or frozen vegetables (raw, steamed, roasted, or grilled). Low-sodium or reduced-sodium tomato and vegetable juices. Low-sodium or reduced-sodium tomato sauce and paste. Low-sodium or reduced-sodium canned vegetables.   Fruits All fresh, canned (in natural juice), or frozen fruits.  Protein Products  All fish and seafood.  Dried beans, peas, or lentils. Unsalted nuts and seeds. Unsalted canned beans.  Dairy Low-fat dairy products, such as skim or 1% milk, 2% or reduced-fat cheeses, low-fat ricotta or cottage cheese, or plain low-fat yogurt. Low-sodium or reduced-sodium cheeses.  Fats and Oils Tub margarines without trans fats. Light or reduced-fat mayonnaise and salad dressings (reduced sodium). Avocado. Safflower, olive, or canola oils. Natural peanut or almond butter.  Other Unsalted popcorn and pretzels. The items listed above may not be a complete list of recommended foods or beverages. Contact your dietitian for more options.  ++++++++++++++++++++  WHAT FOODS ARE NOT RECOMMENDED? Grains/ White flour or wheat flour White bread. White pasta. White rice. Refined cornbread. Bagels and croissants. Crackers that contain trans fat.  Vegetables  Creamed or fried vegetables. Vegetables in a . Regular canned vegetables. Regular canned tomato sauce and paste. Regular tomato and vegetable juices.  Fruits Dried fruits. Canned fruit in light or heavy syrup. Fruit juice.  Meat and Other Protein Products Meat in general - RED meat & White meat.  Fatty cuts of meat. Ribs, chicken wings, all processed meats as bacon, sausage, bologna, salami,  fatback, hot dogs, bratwurst and packaged luncheon meats.  Dairy Whole or 2% milk, cream, half-and-half, and cream cheese. Whole-fat or sweetened yogurt. Full-fat cheeses or blue cheese. Non-dairy creamers and whipped toppings. Processed cheese, cheese spreads, or cheese curds.  Condiments Onion and garlic salt, seasoned salt, table salt, and sea salt. Canned and packaged gravies. Worcestershire sauce. Tartar sauce. Barbecue sauce. Teriyaki sauce. Soy sauce, including reduced sodium. Steak sauce. Fish sauce. Oyster sauce. Cocktail sauce. Horseradish. Ketchup and mustard. Meat flavorings and tenderizers. Bouillon cubes. Hot sauce. Tabasco sauce. Marinades. Taco seasonings. Relishes.  Fats and Oils Butter, stick margarine, lard, shortening and bacon fat. Coconut, palm kernel, or palm oils. Regular salad dressings.  Pickles and olives. Salted popcorn and pretzels.  The items  listed above may not be a complete list of foods and beverages to avoid.

## 2019-09-04 LAB — CBC WITH DIFFERENTIAL/PLATELET
Absolute Monocytes: 816 cells/uL (ref 200–950)
Basophils Absolute: 77 cells/uL (ref 0–200)
Basophils Relative: 0.9 %
Eosinophils Absolute: 238 cells/uL (ref 15–500)
Eosinophils Relative: 2.8 %
HCT: 47.4 % (ref 38.5–50.0)
Hemoglobin: 16.2 g/dL (ref 13.2–17.1)
Lymphs Abs: 2661 cells/uL (ref 850–3900)
MCH: 29 pg (ref 27.0–33.0)
MCHC: 34.2 g/dL (ref 32.0–36.0)
MCV: 84.9 fL (ref 80.0–100.0)
MPV: 9.4 fL (ref 7.5–12.5)
Monocytes Relative: 9.6 %
Neutro Abs: 4709 cells/uL (ref 1500–7800)
Neutrophils Relative %: 55.4 %
Platelets: 300 10*3/uL (ref 140–400)
RBC: 5.58 10*6/uL (ref 4.20–5.80)
RDW: 12.4 % (ref 11.0–15.0)
Total Lymphocyte: 31.3 %
WBC: 8.5 10*3/uL (ref 3.8–10.8)

## 2019-09-04 LAB — COMPLETE METABOLIC PANEL WITH GFR
AG Ratio: 1.8 (calc) (ref 1.0–2.5)
ALT: 32 U/L (ref 9–46)
AST: 22 U/L (ref 10–35)
Albumin: 4.2 g/dL (ref 3.6–5.1)
Alkaline phosphatase (APISO): 74 U/L (ref 35–144)
BUN: 11 mg/dL (ref 7–25)
CO2: 27 mmol/L (ref 20–32)
Calcium: 9.7 mg/dL (ref 8.6–10.3)
Chloride: 105 mmol/L (ref 98–110)
Creat: 1.06 mg/dL (ref 0.70–1.25)
GFR, Est African American: 84 mL/min/{1.73_m2} (ref >=60)
GFR, Est Non African American: 72 mL/min/{1.73_m2} (ref >=60)
Globulin: 2.3 g/dL (calc) (ref 1.9–3.7)
Glucose, Bld: 87 mg/dL (ref 65–99)
Potassium: 4.3 mmol/L (ref 3.5–5.3)
Sodium: 139 mmol/L (ref 135–146)
Total Bilirubin: 0.4 mg/dL (ref 0.2–1.2)
Total Protein: 6.5 g/dL (ref 6.1–8.1)

## 2019-09-04 LAB — LIPID PANEL
Cholesterol: 155 mg/dL (ref <200)
HDL: 31 mg/dL — ABNORMAL LOW (ref >=40)
LDL Cholesterol (Calc): 97 mg/dL (calc)
Non-HDL Cholesterol (Calc): 124 mg/dL (calc) (ref <130)
Total CHOL/HDL Ratio: 5 (calc) — ABNORMAL HIGH (ref <5.0)
Triglycerides: 172 mg/dL — ABNORMAL HIGH (ref <150)

## 2019-09-04 LAB — HEPATITIS C ANTIBODY
Hepatitis C Ab: NONREACTIVE
SIGNAL TO CUT-OFF: 0.87 (ref <1.00)

## 2019-09-10 ENCOUNTER — Ambulatory Visit: Payer: PPO | Attending: Internal Medicine

## 2019-09-10 DIAGNOSIS — Z23 Encounter for immunization: Secondary | ICD-10-CM

## 2019-09-10 NOTE — Progress Notes (Signed)
   Covid-19 Vaccination Clinic  Name:  Adam Hammond    MRN: [id number] DOB: May 15, 1951  09/10/2019  [name] was observed post Covid-19 immunization for 15 minutes without incident. He was provided with Vaccine Information Sheet and instruction to access the V-Safe system.   [name] was instructed to call 911 with any severe reactions post vaccine: Marland Kitchen Difficulty breathing  . Swelling of face and throat  . A fast heartbeat  . A bad rash all over body  . Dizziness and weakness   Immunizations Administered    Name Date Dose VIS Date Route   Pfizer COVID-19 Vaccine 09/10/2019  2:07 PM 0.3 mL 05/31/2019 Intramuscular   Manufacturer: [place]   Lot: [id number]   [place]: [id number]

## 2020-05-05 ENCOUNTER — Other Ambulatory Visit: Payer: Self-pay

## 2020-05-05 ENCOUNTER — Encounter: Payer: Self-pay | Admitting: Internal Medicine

## 2020-05-05 ENCOUNTER — Ambulatory Visit (INDEPENDENT_AMBULATORY_CARE_PROVIDER_SITE_OTHER): Payer: PPO | Admitting: Internal Medicine

## 2020-05-05 VITALS — BP 110/70 | HR 80 | Temp 97.7°F | Ht 70.5 in | Wt 189.2 lb

## 2020-05-05 DIAGNOSIS — Z125 Encounter for screening for malignant neoplasm of prostate: Secondary | ICD-10-CM

## 2020-05-05 DIAGNOSIS — I10 Essential (primary) hypertension: Secondary | ICD-10-CM | POA: Diagnosis not present

## 2020-05-05 DIAGNOSIS — E782 Mixed hyperlipidemia: Secondary | ICD-10-CM | POA: Diagnosis not present

## 2020-05-05 DIAGNOSIS — Z0001 Encounter for general adult medical examination with abnormal findings: Secondary | ICD-10-CM

## 2020-05-05 DIAGNOSIS — E559 Vitamin D deficiency, unspecified: Secondary | ICD-10-CM

## 2020-05-05 DIAGNOSIS — Z0000 Encounter for general adult medical examination without abnormal findings: Secondary | ICD-10-CM

## 2020-05-05 DIAGNOSIS — N401 Enlarged prostate with lower urinary tract symptoms: Secondary | ICD-10-CM

## 2020-05-05 DIAGNOSIS — R7309 Other abnormal glucose: Secondary | ICD-10-CM | POA: Diagnosis not present

## 2020-05-05 DIAGNOSIS — Z79899 Other long term (current) drug therapy: Secondary | ICD-10-CM

## 2020-05-05 DIAGNOSIS — R0989 Other specified symptoms and signs involving the circulatory and respiratory systems: Secondary | ICD-10-CM

## 2020-05-05 DIAGNOSIS — Z8249 Family history of ischemic heart disease and other diseases of the circulatory system: Secondary | ICD-10-CM | POA: Diagnosis not present

## 2020-05-05 DIAGNOSIS — Z136 Encounter for screening for cardiovascular disorders: Secondary | ICD-10-CM | POA: Diagnosis not present

## 2020-05-05 DIAGNOSIS — Z1211 Encounter for screening for malignant neoplasm of colon: Secondary | ICD-10-CM

## 2020-05-05 DIAGNOSIS — Z87891 Personal history of nicotine dependence: Secondary | ICD-10-CM

## 2020-05-05 DIAGNOSIS — N138 Other obstructive and reflux uropathy: Secondary | ICD-10-CM

## 2020-05-05 NOTE — Patient Instructions (Signed)

## 2020-05-05 NOTE — Progress Notes (Signed)
Annual  Screening/Preventative Visit  & Comprehensive Evaluation & Examination      This very nice 69 y.o.  MWM presents for a Screening /Preventative Visit & comprehensive evaluation and management of multiple medical co-morbidities.  Patient has been followed for HTN, HLD, Prediabetes and Vitamin D Deficiency.     Labile  HTN predates since 2007. Patient's BP has been controlled at home.  Today's BP is at goal - 110/70. Patient denies any cardiac symptoms as chest pain, palpitations, shortness of breath, dizziness or ankle swelling.      Patient's hyperlipidemia is near controlled with diet . Last lipids were at goal except elevated Trig's:  Lab Results  Component Value Date   CHOL 155 09/03/2019   HDL 31 (L) 09/03/2019   LDLCALC 97 09/03/2019   TRIG 172 (H) 09/03/2019   CHOLHDL 5.0 (H) 09/03/2019       Patient is monitored for Glucose Intolerance and patient denies reactive hypoglycemic symptoms, visual blurring, diabetic polys or paresthesias. Last A1c was Normal & at goal:   Lab Results  Component Value Date   HGBA1C 5.4 04/16/2019        Finally, patient has history of Vitamin D Deficiency ("28" /2015) and last vitamin D was very low:   Lab Results  Component Value Date   VD25OH 25 (L) 04/16/2019    Current Outpatient Medications on File Prior to Visit  Medication Sig  . VITAMIN D PO Takes occasionally    No Known Allergies   Health Maintenance  Topic Date Due  . COLONOSCOPY  Never done  . PNA vac Low Risk Adult (1 of 2 - PCV13) Never done  . INFLUENZA VACCINE  Never done  . TETANUS/TDAP  08/18/2024  . COVID-19 Vaccine  Completed  . Hepatitis C Screening  Completed   Immunization History  Administered Date(s) Administered  . DTaP 05/20/2006  . PFIZER SARS-COV-2 Vaccination 08/15/2019, 09/10/2019  . PPD Test 07/17/2013  . Td 05/20/2006  . Tdap 08/19/2014   Last Colon -   Past Surgical History:  Procedure Laterality Date  . MOHS SURGERY Left 2014    ear BCC  . SHOULDER SURGERY Left 1997  . WRIST SURGERY Left 1997    Social History   Socioeconomic History  . Marital status: Married    Spouse name: Anderson Malta  . Number of children: None  Occupational History  . Not on file  Tobacco Use  . Smoking status: Former Smoker    Quit date: 05/14/2012    Years since quitting: 7.9  . Smokeless tobacco: Never Used  . Tobacco comment: uses E cig daily  Substance and Sexual Activity  . Alcohol use: Not on file  . Drug use: No  . Sexual activity: Not on file    ROS Constitutional: Denies fever, chills, weight loss/gain, headaches, insomnia,  night sweats or change in appetite. Does c/o fatigue. Eyes: Denies redness, blurred vision, diplopia, discharge, itchy or watery eyes.  ENT: Denies discharge, congestion, post nasal drip, epistaxis, sore throat, earache, hearing loss, dental pain, Tinnitus, Vertigo, Sinus pain or snoring.  Cardio: Denies chest pain, palpitations, irregular heartbeat, syncope, dyspnea, diaphoresis, orthopnea, PND, claudication or edema Respiratory: denies cough, dyspnea, DOE, pleurisy, hoarseness, laryngitis or wheezing.  Gastrointestinal: Denies dysphagia, heartburn, reflux, water brash, pain, cramps, nausea, vomiting, bloating, diarrhea, constipation, hematemesis, melena, hematochezia, jaundice or hemorrhoids Genitourinary: Denies dysuria, frequency, urgency, nocturia, hesitancy, discharge, hematuria or flank pain Musculoskeletal: Denies arthralgia, myalgia, stiffness, Jt. Swelling, pain, limp or strain/sprain. Denies Falls.  Skin: Denies puritis, rash, hives, warts, acne, eczema or change in skin lesion Neuro: No weakness, tremor, incoordination, spasms, paresthesia or pain Psychiatric: Denies confusion, memory loss or sensory loss. Denies Depression. Endocrine: Denies change in weight, skin, hair change, nocturia, and paresthesia, diabetic polys, visual blurring or hyper / hypo glycemic episodes.  Heme/Lymph: No  excessive bleeding, bruising or enlarged lymph nodes.  Physical Exam  BP 110/70   Pulse 80   Temp 97.7 F (36.5 C)   Ht 5' 10.5" (1.791 m)   Wt 189 lb 3.2 oz (85.8 kg)   SpO2 97%   BMI 26.76 kg/m   General Appearance: Well nourished and well groomed and in no apparent distress.  Eyes: PERRLA, EOMs, conjunctiva no swelling or erythema, normal fundi and vessels. Sinuses: No frontal/maxillary tenderness ENT/Mouth: EACs patent / TMs  nl. Nares clear without erythema, swelling, mucoid exudates. Oral hygiene is good. No erythema, swelling, or exudate. Tongue normal, non-obstructing. Tonsils not swollen or erythematous. Hearing normal.  Neck: Supple, thyroid not palpable. No bruits, nodes or JVD. Respiratory: Respiratory effort normal.  BS equal and clear bilateral without rales, rhonci, wheezing or stridor. Cardio: Heart sounds are normal with regular rate and rhythm and no murmurs, rubs or gallops. Peripheral pulses are normal and equal bilaterally without edema. No aortic or femoral bruits. Chest: symmetric with normal excursions and percussion.  Abdomen: Soft, with Nl bowel sounds. Nontender, no guarding, rebound, hernias, masses, or organomegaly.  Lymphatics: Non tender without lymphadenopathy.  Musculoskeletal: Full ROM all peripheral extremities, joint stability, 5/5 strength, and normal gait. Skin: Warm and dry without rashes, lesions, cyanosis, clubbing or  ecchymosis.  Neuro: Cranial nerves intact, reflexes equal bilaterally. Normal muscle tone, no cerebellar symptoms. Sensation intact.  Pysch: Alert and oriented X 3 with normal affect, insight and judgment appropriate.   Assessment and Plan  1. Annual Preventative/Screening Exam    2. Labile hypertension  - EKG 12-Lead - [place], RETROPERITNL ABD,  LTD - Urinalysis, Routine w reflex microscopic - Microalbumin / creatinine urine ratio - CBC with Differential/Platelet - COMPLETE METABOLIC PANEL WITH GFR - Magnesium -  TSH  3. Hyperlipidemia, mixed  - EKG 12-Lead - [place], RETROPERITNL ABD,  LTD - Lipid panel  4. Abnormal glucose  - EKG 12-Lead - [place], RETROPERITNL ABD,  LTD - Hemoglobin A1c - Insulin, random  5. Vitamin D deficiency  - VITAMIN D 25 Hydroxy   6. BPH with obstruction/lower urinary tract symptoms  - PSA  7. Screening for colorectal cancer  - POC Hemoccult Bld/Stl   8. Prostate cancer screening  - PSA  9. Screening for ischemic heart disease  - EKG 12-Lead - [place], RETROPERITNL ABD,  LTD  10. FHx: heart disease  - EKG 12-Lead - [place], RETROPERITNL ABD,  LTD  11. Former smoker  - EKG 12-Lead - [place], RETROPERITNL ABD,  LTD  12. Screening for AAA (aortic abdominal aneurysm)  - [place], RETROPERITNL ABD,  LTD  13. Medication management  - Urinalysis, Routine w reflex microscopic - Microalbumin / creatinine urine ratio - CBC with Differential/Platelet - COMPLETE METABOLIC PANEL WITH GFR - Magnesium - Lipid panel - TSH - Hemoglobin A1c - Insulin, random - VITAMIN D 25 Hydroxyl         Patient was counseled in prudent diet, weight control to achieve/maintain BMI less than 25, BP monitoring, regular exercise and medications as discussed.  Discussed med effects and SE's. Routine screening labs and tests as requested with regular follow-up as recommended. Over 66  minutes of exam, counseling, chart review and high complex critical decision making was performed   Kirtland Bouchard, MD

## 2020-05-06 LAB — CBC WITH DIFFERENTIAL/PLATELET
Absolute Monocytes: 675 cells/uL (ref 200–950)
Basophils Absolute: 76 cells/uL (ref 0–200)
Basophils Relative: 0.8 %
Eosinophils Absolute: 238 cells/uL (ref 15–500)
Eosinophils Relative: 2.5 %
HCT: 47.5 % (ref 38.5–50.0)
Hemoglobin: 16.6 g/dL (ref 13.2–17.1)
Lymphs Abs: 2489 cells/uL (ref 850–3900)
MCH: 29.2 pg (ref 27.0–33.0)
MCHC: 34.9 g/dL (ref 32.0–36.0)
MCV: 83.6 fL (ref 80.0–100.0)
MPV: 9.4 fL (ref 7.5–12.5)
Monocytes Relative: 7.1 %
Neutro Abs: 6023 cells/uL (ref 1500–7800)
Neutrophils Relative %: 63.4 %
Platelets: 312 10*3/uL (ref 140–400)
RBC: 5.68 10*6/uL (ref 4.20–5.80)
RDW: 12.7 % (ref 11.0–15.0)
Total Lymphocyte: 26.2 %
WBC: 9.5 10*3/uL (ref 3.8–10.8)

## 2020-05-06 LAB — HEMOGLOBIN A1C
Hgb A1c MFr Bld: 5.3 % of total Hgb (ref <5.7)
Mean Plasma Glucose: 105 (calc)
eAG (mmol/L): 5.8 (calc)

## 2020-05-06 LAB — LIPID PANEL
Cholesterol: 157 mg/dL (ref <200)
HDL: 31 mg/dL — ABNORMAL LOW (ref >=40)
LDL Cholesterol (Calc): 96 mg/dL (calc)
Non-HDL Cholesterol (Calc): 126 mg/dL (calc) (ref <130)
Total CHOL/HDL Ratio: 5.1 (calc) — ABNORMAL HIGH (ref <5.0)
Triglycerides: 208 mg/dL — ABNORMAL HIGH (ref <150)

## 2020-05-06 LAB — URINALYSIS, ROUTINE W REFLEX MICROSCOPIC
Bacteria, UA: NONE SEEN /HPF
Bilirubin Urine: NEGATIVE
Glucose, UA: NEGATIVE
Hyaline Cast: NONE SEEN /LPF
Ketones, ur: NEGATIVE
Leukocytes,Ua: NEGATIVE
Nitrite: NEGATIVE
Protein, ur: NEGATIVE
Specific Gravity, Urine: 1.017 (ref 1.001–1.03)
Squamous Epithelial / HPF: NONE SEEN /HPF (ref <=5)
WBC, UA: NONE SEEN /HPF (ref 0–5)
pH: 6.5 (ref 5.0–8.0)

## 2020-05-06 LAB — COMPLETE METABOLIC PANEL WITH GFR
AG Ratio: 2.2 (calc) (ref 1.0–2.5)
ALT: 24 U/L (ref 9–46)
AST: 20 U/L (ref 10–35)
Albumin: 4.6 g/dL (ref 3.6–5.1)
Alkaline phosphatase (APISO): 83 U/L (ref 35–144)
BUN: 9 mg/dL (ref 7–25)
CO2: 24 mmol/L (ref 20–32)
Calcium: 10 mg/dL (ref 8.6–10.3)
Chloride: 104 mmol/L (ref 98–110)
Creat: 0.96 mg/dL (ref 0.70–1.25)
GFR, Est African American: 94 mL/min/{1.73_m2} (ref >=60)
GFR, Est Non African American: 81 mL/min/{1.73_m2} (ref >=60)
Globulin: 2.1 g/dL (calc) (ref 1.9–3.7)
Glucose, Bld: 123 mg/dL — ABNORMAL HIGH (ref 65–99)
Potassium: 4 mmol/L (ref 3.5–5.3)
Sodium: 139 mmol/L (ref 135–146)
Total Bilirubin: 0.6 mg/dL (ref 0.2–1.2)
Total Protein: 6.7 g/dL (ref 6.1–8.1)

## 2020-05-06 LAB — MAGNESIUM: Magnesium: 2.2 mg/dL (ref 1.5–2.5)

## 2020-05-06 LAB — INSULIN, RANDOM: Insulin: 119.2 u[IU]/mL — ABNORMAL HIGH

## 2020-05-06 LAB — TSH: TSH: 0.49 mIU/L (ref 0.40–4.50)

## 2020-05-06 LAB — PSA: PSA: 0.82 ng/mL (ref <=4.0)

## 2020-05-06 LAB — MICROALBUMIN / CREATININE URINE RATIO
Creatinine, Urine: 171 mg/dL (ref 20–320)
Microalb Creat Ratio: 4 mcg/mg creat (ref <30)
Microalb, Ur: 0.7 mg/dL

## 2020-05-06 LAB — VITAMIN D 25 HYDROXY (VIT D DEFICIENCY, FRACTURES): Vit D, 25-Hydroxy: 48 ng/mL (ref 30–100)

## 2020-05-06 NOTE — Progress Notes (Signed)
========================================================== -   Test results slightly outside the reference range are not unusual. If there is anything important, I will review this with you,  otherwise it is considered normal test values.  If you have further questions,  please do not hesitate to contact me at the office or via My Chart.  ==========================================================  -  PSA -Low - Great  ! ==========================================================  -  Total Chol = 157 -  Excellent   - Very low risk for Heart Attack  / Stroke =============================================================  - Triglycerides (   208  ) or fats in blood are too high  (goal is less than 150)    - Recommend avoid fried & greasy foods,  sweets / candy,   - Avoid white rice  (brown or wild rice or Quinoa is OK),   - Avoid white potatoes  (sweet potatoes are OK)   - Avoid anything made from white flour  - bagels, doughnuts, rolls, buns, biscuits, white and   wheat breads, pizza crust and traditional  pasta made of white flour & egg white  - (vegetarian pasta or spinach or wheat pasta is OK).    - Multi-grain bread is OK - like multi-grain flat bread or  sandwich thins.   - Avoid alcohol in excess.   - Exercise is also important. ==========================================================  -  A1c - Normal -great - No Diabetes ! ==========================================================  -  Vit D = 48 - Low   - Vitamin D goal is between 70-100.   - Please Take  Vitamin D 5,000 unit caps x 2 caps = 10,000 units /day   - It is very important as a natural anti-inflammatory and helping the  immune system protect against viral infections, like the Covid-19    helping hair, skin, and nails, as well as reducing stroke and  heart attack risk.   - It helps your bones and helps with mood.  - It also decreases numerous cancer risks so please  take it as directed.   -  Low Vit D is associated with a 200-300% higher risk for  CANCER   and 200-300% higher risk for HEART   ATTACK  &  STROKE.    - It is also associated with higher death rate at younger ages,   autoimmune diseases like Rheumatoid arthritis, Lupus,  Multiple Sclerosis.     - Also many other serious conditions, like depression, Alzheimer's  Dementia, infertility, muscle aches, fatigue, fibromyalgia   - just to name a few. ========================================================== All Else - CBC - Kidneys - Electrolytes - Liver - Magnesium & Thyroid    - all  Normal / OK ====================================================   - Keep up the Great Work   ! ==========================================================  -

## 2020-08-24 ENCOUNTER — Encounter: Payer: Self-pay | Admitting: Adult Health Nurse Practitioner

## 2020-08-24 ENCOUNTER — Other Ambulatory Visit: Payer: Self-pay

## 2020-08-24 ENCOUNTER — Ambulatory Visit (INDEPENDENT_AMBULATORY_CARE_PROVIDER_SITE_OTHER): Payer: PPO | Admitting: Adult Health Nurse Practitioner

## 2020-08-24 VITALS — BP 122/86 | HR 63 | Temp 97.6°F | Ht 71.0 in | Wt 179.0 lb

## 2020-08-24 DIAGNOSIS — E782 Mixed hyperlipidemia: Secondary | ICD-10-CM | POA: Diagnosis not present

## 2020-08-24 DIAGNOSIS — R030 Elevated blood-pressure reading, without diagnosis of hypertension: Secondary | ICD-10-CM

## 2020-08-24 DIAGNOSIS — R6889 Other general symptoms and signs: Secondary | ICD-10-CM | POA: Diagnosis not present

## 2020-08-24 DIAGNOSIS — Z6827 Body mass index (BMI) 27.0-27.9, adult: Secondary | ICD-10-CM

## 2020-08-24 DIAGNOSIS — E559 Vitamin D deficiency, unspecified: Secondary | ICD-10-CM | POA: Diagnosis not present

## 2020-08-24 DIAGNOSIS — E291 Testicular hypofunction: Secondary | ICD-10-CM | POA: Diagnosis not present

## 2020-08-24 DIAGNOSIS — N200 Calculus of kidney: Secondary | ICD-10-CM | POA: Diagnosis not present

## 2020-08-24 DIAGNOSIS — Z0000 Encounter for general adult medical examination without abnormal findings: Secondary | ICD-10-CM | POA: Diagnosis not present

## 2020-08-24 DIAGNOSIS — Z0001 Encounter for general adult medical examination with abnormal findings: Secondary | ICD-10-CM | POA: Diagnosis not present

## 2020-08-24 DIAGNOSIS — Z79899 Other long term (current) drug therapy: Secondary | ICD-10-CM

## 2020-08-24 DIAGNOSIS — N138 Other obstructive and reflux uropathy: Secondary | ICD-10-CM | POA: Diagnosis not present

## 2020-08-24 DIAGNOSIS — N401 Enlarged prostate with lower urinary tract symptoms: Secondary | ICD-10-CM

## 2020-08-24 DIAGNOSIS — D649 Anemia, unspecified: Secondary | ICD-10-CM

## 2020-08-24 NOTE — Progress Notes (Signed)
MEDICARE ANNUAL WELLNESS VISIT AND 3 MONTH FOLLOW UP  Assessment:   Diagnoses and all orders for this visit:  Medicare annual wellness visit, subsequent Yearly   Elevated blood pressure reading without diagnosis of hypertension Discussed dietary and exercise modifications Discussed DASH diet.   Reminder to go to the ER if any CP, SOB, nausea, dizziness, severe HA, changes vision/speech, left arm numbness and tingling and jaw pain.  Hyperlipademia Controlled with diet and exercise  Hypogonadism male Asymptomatic  Nephrolithiasis History of Asymptomatic Continue to monitor Increase fluid intake  BPH with LUTS Doing well at this time  No medications Continue to monitor  Anemia, unspecified type Asymptomatic Will check labs next office visit  Vitamin D deficiency Takes Vitamin D3 10,000IU daily Discussed supplementation Will check labs next office visit  BMI 24.0-24.9 Discussed dietary and exercise modifications Down 10lbs from last OV  Medication Management Continued  Patient to follow up in 3-4 months for follow up or sooner if needed.  I provided 30 minutes of  face-to-face time during this encounter including counseling, chart review, and critical decision making was preformed.   Future Appointments  Date Time Provider [place]  05/20/2021  2:00 PM Unk Pinto, MD [facility] None  08/24/2021  2:30 PM Garnet Sierras, NP [facility] None      Plan:   During the course of the visit the patient was educated and counseled about appropriate screening and preventive services including:    Pneumococcal vaccine   Influenza vaccine  Prevnar 13  Td vaccine  Colorectal cancer screening  Diabetes screening  Glaucoma screening  Nutrition counseling    Subjective:  Adam Hammond is a 70 y.o. male who presents for Medicare Annual Wellness Visit.  He has history of elevated blood pressure without diagnosis of HTN, nephrolithiasis,  hypogonadism, anemia and low back pain.  Today he reports that he is doing well overall.  He does not have any health or medication concerns today.  Reports his low back pain, that has since resolved.  He does exercise 7 days a week by walking his dog  He does this for 29min every day.  Body mass index is 24.97 kg/m.  He does workout by means of walking his dogs and yard work. He denies chest pain, shortness of breath, dizziness.  He is not on cholesterol medication and denies myalgias. His cholesterol is not at goal but last check was over three years ago. The cholesterol last visit was:   Lab Results  Component Value Date   CHOL 157 05/05/2020   HDL 31 (L) 05/05/2020   LDLCALC 96 05/05/2020   TRIG 208 (H) 05/05/2020   CHOLHDL 5.1 (H) 05/05/2020   He has not been working on diet and exercise for prediabetes, and denies hyperglycemia, hypoglycemia , increased appetite, nausea, paresthesia of the feet, polydipsia, polyuria, visual disturbances, vomiting and weight loss. Last A1C in the office was:  Lab Results  Component Value Date   HGBA1C 5.4 04/16/2019   Last GFR Lab Results  Component Value Date   GFRNONAA 78 04/16/2019     Lab Results  Component Value Date   GFRAA 90 04/16/2019   Patient is on not Vitamin D supplement.   Lab Results  Component Value Date   VD25OH 25 (L) 04/16/2019     He has a history of testosterone deficiency and is not on testosterone replacement. He denies decrease in energy, libido, or muscle mass.  He is not on testosterone replacement. Lab Results  Component Value Date   TESTOSTERONE 276 (L) 08/19/2014   Medication Review:       Current Outpatient Medications (Other):  Marland Kitchen  VITAMIN D PO, Take by mouth. Takes occasionally  Allergies: No Known Allergies  Current Problems (verified) has Nephrolithiasis; BCC (basal cell carcinoma), ear; Elevated BP without diagnosis of hypertension; Hypogonadism male; Labile hypertension; Abnormal  glucose; Vitamin D deficiency; Screening for colorectal cancer; and BPH with obstruction/lower urinary tract symptoms on their problem list.  Screening Tests Immunization History  Administered Date(s) Administered  . DTaP 05/20/2006  . PFIZER(Purple Top)SARS-COV-2 Vaccination 08/15/2019, 09/10/2019  . PPD Test 07/17/2013  . Td 05/20/2006  . Tdap 08/19/2014    Preventative care: Last colonoscopy: Continues to decline  Prior vaccinations: TD or Tdap: 2016  Influenza: Declines  Pneumococcal: Declines Prevnar13: Declines Shingles/Zostavax: Declines Shingrix, DUE COVID- Next week second shot.    Names of Other Physician/Practitioners you currently use: 1. [facility] here for primary care 2. Eye Exam, 2019 DUE for 2022 3. Dental Exam:2019  DUE for 2022  CXR 2014 DERM: 2016, WNL  Patient Care Team: Unk Pinto, MD as PCP - General (Internal Medicine)  Surgical: He  has a past surgical history that includes Shoulder surgery (Left, 1997); Wrist surgery (Left, 1997); and Mohs surgery (Left, 2014). Family His family history includes Alcohol abuse in his father; Cancer in his father, mother, and another family member; Diabetes in his sister; Heart attack in his father and sister; Heart disease in his father, sister, and sister; Hyperlipidemia in his father, mother, sister, and sister; Hypertension in his mother. Social history  He reports that he quit smoking about 8 years ago. He has never used smokeless tobacco. He reports that he does not use drugs. No history on file for alcohol use.  MEDICARE WELLNESS OBJECTIVES: Physical activity: Current Exercise Habits: Home exercise routine, Type of exercise: walking (Walks dog 3-4 times a week), Time (Minutes): 20, Frequency (Times/Week): 4, Weekly Exercise (Minutes/Week): 80, Intensity: Mild, Exercise limited by: None identified Cardiac risk factors: Cardiac Risk Factors include: advanced age  (>79men, >45 women) Depression/mood screen:   Depression screen [facility] 2/9 08/24/2020  Decreased Interest 0  Down, Depressed, Hopeless 0  PHQ - 2 Score 0    ADLs:  In your present state of health, do you have any difficulty performing the following activities: 08/24/2020 09/03/2019  Hearing? N N  Vision? N N  Difficulty concentrating or making decisions? N N  Walking or climbing stairs? N N  Dressing or bathing? N N  Doing errands, shopping? N N  Preparing Food and eating ? N N  Using the Toilet? N N  In the past six months, have you accidently leaked urine? N N  Do you have problems with loss of bowel control? N N  Managing your Medications? N N  Managing your Finances? N N  Housekeeping or managing your Housekeeping? N N  Some recent data might be hidden     Cognitive Testing  Alert? Yes  Normal Appearance?Yes  Oriented to person? Yes  Place? Yes   Time? Yes  Recall of three objects?  Yes  Can perform simple calculations? Yes  Displays appropriate judgment?Yes  Can read the correct time from a watch face?Yes  EOL planning: Does Patient Have a Medical Advance Directive?: No   Objective:   Today's Vitals   08/24/20 1427  BP: 122/86  Pulse: 63  Temp: 97.6 F (36.4 C)  SpO2: 98%  Weight: 179  lb (81.2 kg)  Height: 5\' 11"  (1.803 m)  PainSc: 0-No pain   Body mass index is 24.97 kg/m.  General Appearance: Well nourished, in no apparent distress. Eyes: PERRLA, EOMs, conjunctiva no swelling or erythema Sinuses: No Frontal/maxillary tenderness ENT/Mouth: Ext aud canals clear, TMs without erythema, bulging. No erythema, swelling, or exudate on post pharynx.  Tonsils not swollen or erythematous. Hearing normal.  Neck: Supple, thyroid normal.  Respiratory: Respiratory effort normal, BS equal bilaterally without rales, rhonchi, wheezing or stridor.  Cardio: RRR with no MRGs. Brisk peripheral pulses without edema.  Abdomen: Soft, + BS.  Non tender, no guarding, rebound, hernias,  masses. Lymphatics: Non tender without lymphadenopathy.  Musculoskeletal: Full ROM, 5/5 strength, Normal gait Skin: Warm, dry without rashes, lesions, ecchymosis.  Neuro: Cranial nerves intact. No cerebellar symptoms.  Psych: Awake and oriented X 3, normal affect, Insight and Judgment appropriate.    Medicare Attestation I have personally reviewed: The patient's medical and social history Their use of alcohol, tobacco or illicit drugs Their current medications and supplements The patient's functional ability including ADLs,fall risks, home safety risks, cognitive, and hearing and visual impairment Diet and physical activities Evidence for depression or mood disorders  The patient's weight, height, BMI, and visual acuity have been recorded in the chart.  I have made referrals, counseling, and provided education to the patient based on review of the above and I have provided the patient with a written personalized care plan for preventive services.     Garnet Sierras, NP [facility] 08/24/2020  3:07 PM

## 2020-08-25 LAB — CBC WITH DIFFERENTIAL/PLATELET
Absolute Monocytes: 764 cells/uL (ref 200–950)
Basophils Absolute: 84 cells/uL (ref 0–200)
Basophils Relative: 1 %
Eosinophils Absolute: 260 cells/uL (ref 15–500)
Eosinophils Relative: 3.1 %
HCT: 47.8 % (ref 38.5–50.0)
Hemoglobin: 16.6 g/dL (ref 13.2–17.1)
Lymphs Abs: 2680 cells/uL (ref 850–3900)
MCH: 29.2 pg (ref 27.0–33.0)
MCHC: 34.7 g/dL (ref 32.0–36.0)
MCV: 84.2 fL (ref 80.0–100.0)
MPV: 9.9 fL (ref 7.5–12.5)
Monocytes Relative: 9.1 %
Neutro Abs: 4612 cells/uL (ref 1500–7800)
Neutrophils Relative %: 54.9 %
Platelets: 288 10*3/uL (ref 140–400)
RBC: 5.68 10*6/uL (ref 4.20–5.80)
RDW: 12.6 % (ref 11.0–15.0)
Total Lymphocyte: 31.9 %
WBC: 8.4 10*3/uL (ref 3.8–10.8)

## 2020-08-25 LAB — COMPLETE METABOLIC PANEL WITH GFR
AG Ratio: 2 (calc) (ref 1.0–2.5)
ALT: 19 U/L (ref 9–46)
AST: 18 U/L (ref 10–35)
Albumin: 4.5 g/dL (ref 3.6–5.1)
Alkaline phosphatase (APISO): 73 U/L (ref 35–144)
BUN: 12 mg/dL (ref 7–25)
CO2: 28 mmol/L (ref 20–32)
Calcium: 10 mg/dL (ref 8.6–10.3)
Chloride: 105 mmol/L (ref 98–110)
Creat: 1.14 mg/dL (ref 0.70–1.25)
GFR, Est African American: 76 mL/min/{1.73_m2} (ref >=60)
GFR, Est Non African American: 66 mL/min/{1.73_m2} (ref >=60)
Globulin: 2.2 g/dL (calc) (ref 1.9–3.7)
Glucose, Bld: 92 mg/dL (ref 65–99)
Potassium: 4.5 mmol/L (ref 3.5–5.3)
Sodium: 142 mmol/L (ref 135–146)
Total Bilirubin: 0.5 mg/dL (ref 0.2–1.2)
Total Protein: 6.7 g/dL (ref 6.1–8.1)

## 2020-08-25 LAB — LIPID PANEL
Cholesterol: 151 mg/dL (ref <200)
HDL: 32 mg/dL — ABNORMAL LOW (ref >=40)
LDL Cholesterol (Calc): 92 mg/dL (calc)
Non-HDL Cholesterol (Calc): 119 mg/dL (calc) (ref <130)
Total CHOL/HDL Ratio: 4.7 (calc) (ref <5.0)
Triglycerides: 169 mg/dL — ABNORMAL HIGH (ref <150)

## 2020-08-25 LAB — VITAMIN D 25 HYDROXY (VIT D DEFICIENCY, FRACTURES): Vit D, 25-Hydroxy: 105 ng/mL — ABNORMAL HIGH (ref 30–100)

## 2021-05-20 ENCOUNTER — Other Ambulatory Visit: Payer: Self-pay

## 2021-05-20 ENCOUNTER — Ambulatory Visit (INDEPENDENT_AMBULATORY_CARE_PROVIDER_SITE_OTHER): Payer: PPO | Admitting: Internal Medicine

## 2021-05-20 ENCOUNTER — Encounter: Payer: Self-pay | Admitting: Internal Medicine

## 2021-05-20 VITALS — BP 110/70 | HR 91 | Temp 97.9°F | Resp 16 | Ht 71.0 in | Wt 178.8 lb

## 2021-05-20 DIAGNOSIS — N138 Other obstructive and reflux uropathy: Secondary | ICD-10-CM

## 2021-05-20 DIAGNOSIS — N401 Enlarged prostate with lower urinary tract symptoms: Secondary | ICD-10-CM | POA: Diagnosis not present

## 2021-05-20 DIAGNOSIS — Z125 Encounter for screening for malignant neoplasm of prostate: Secondary | ICD-10-CM

## 2021-05-20 DIAGNOSIS — Z87891 Personal history of nicotine dependence: Secondary | ICD-10-CM

## 2021-05-20 DIAGNOSIS — R0989 Other specified symptoms and signs involving the circulatory and respiratory systems: Secondary | ICD-10-CM | POA: Diagnosis not present

## 2021-05-20 DIAGNOSIS — Z79899 Other long term (current) drug therapy: Secondary | ICD-10-CM | POA: Diagnosis not present

## 2021-05-20 DIAGNOSIS — E782 Mixed hyperlipidemia: Secondary | ICD-10-CM

## 2021-05-20 DIAGNOSIS — E559 Vitamin D deficiency, unspecified: Secondary | ICD-10-CM | POA: Diagnosis not present

## 2021-05-20 DIAGNOSIS — I10 Essential (primary) hypertension: Secondary | ICD-10-CM | POA: Diagnosis not present

## 2021-05-20 DIAGNOSIS — R7309 Other abnormal glucose: Secondary | ICD-10-CM

## 2021-05-20 DIAGNOSIS — Z0000 Encounter for general adult medical examination without abnormal findings: Secondary | ICD-10-CM

## 2021-05-20 DIAGNOSIS — Z23 Encounter for immunization: Secondary | ICD-10-CM | POA: Diagnosis not present

## 2021-05-20 DIAGNOSIS — Z0001 Encounter for general adult medical examination with abnormal findings: Secondary | ICD-10-CM

## 2021-05-20 DIAGNOSIS — Z136 Encounter for screening for cardiovascular disorders: Secondary | ICD-10-CM | POA: Diagnosis not present

## 2021-05-20 DIAGNOSIS — Z8249 Family history of ischemic heart disease and other diseases of the circulatory system: Secondary | ICD-10-CM

## 2021-05-20 DIAGNOSIS — E8881 Metabolic syndrome: Secondary | ICD-10-CM

## 2021-05-20 DIAGNOSIS — Z1211 Encounter for screening for malignant neoplasm of colon: Secondary | ICD-10-CM

## 2021-05-20 NOTE — Progress Notes (Signed)
Annual  Screening/Preventative Visit  & Comprehensive Evaluation & Examination  Future Appointments  Date Time Provider Department  05/20/2021       CPE   2:00 PM Unk Pinto, MD [facility]  08/24/2021        Wellness   2:30 PM Magda Bernheim, NP [facility]  05/23/2022      CPE  2:00 PM Unk Pinto, MD [facility]            This very nice 70 y.o. MWM presents for a Screening /Preventative Visit & comprehensive evaluation and management of multiple medical co-morbidities.  Patient has been followed for HTN, HLD, Prediabetes and Vitamin D Deficiency.       Labile HTN predates since  2007. Patient's BP has been controlled at home.  Today's BP is at goal - 110/70. Patient denies any cardiac symptoms as chest pain, palpitations, shortness of breath, dizziness or ankle swelling.       Patient's hyperlipidemia is controlled with diet and medications. Patient denies myalgias or other medication SE's. Last lipids were at goal with slightly elevated Trig's :  Lab Results  Component Value Date   CHOL 151 08/24/2020   HDL 32 (L) 08/24/2020   LDLCALC 92 08/24/2020   TRIG 169 (H) 08/24/2020   CHOLHDL 4.7 08/24/2020         Patient is monitored expectantly for glucose intolerance & prediabetes. 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 Last A1c was normal :   Lab Results  Component Value Date   HGBA1C 5.3 05/05/2020    Wt Readings from Last 3 Encounters:  05/20/21 178 lb 12.8 oz (81.1 kg)  08/24/20 179 lb (81.2 kg)  05/05/20 189 lb 3.2 oz (85.8 kg)          Finally, patient has history of Vitamin D Deficiency ("28" /2015) and last vitamin D was borderline elevated :   Lab Results  Component Value Date   VD25OH 105 (H) 08/24/2020     Current Outpatient Medications on File Prior to Visit  Medication Sig   VITAMIN D PO Takes occasionally    No Known  Allergies   Past Medical History:  Diagnosis Date   Actinic keratitis    Anemia    BCC (basal cell carcinoma), ear    left ear   Elevated blood pressure reading without diagnosis of hypertension    Hypogonadism male    Nephrolithiasis 2006     Health Maintenance  Topic Date Due   Pneumonia Vaccine 70+ Years old (1 - PCV) Never done   Zoster Vaccines- Shingrix (1 of 2) Never done   COLONOSCOPY  Never done   COVID-19 Vaccine (3 - Pfizer risk series) 10/08/2019   INFLUENZA VACCINE  Never done   TETANUS/TDAP  08/18/2024   Hepatitis C Screening  Completed   HPV VACCINES  Aged Out     Immunization History  Administered Date(s) Administered   DTaP 05/20/2006   PFIZER SARS-COV-2 Vacc 08/15/2019, 09/10/2019   PPD Test 07/17/2013   Td 05/20/2006   Tdap 08/19/2014    Last Colon -   Past Surgical History:  Procedure Laterality Date   MOHS SURGERY Left 2014   ear BCC   SHOULDER SURGERY Left 1997   WRIST SURGERY Left 1997     Family History  Problem Relation Age of Onset   Hypertension Mother    Cancer Mother  metastatic melanoma   Hyperlipidemia Mother    Heart attack Father    Alcohol abuse Father    Heart disease Father    Hyperlipidemia Father    Cancer Father        throat   Diabetes Sister        juvenile   Heart attack Sister    Heart disease Sister    Hyperlipidemia Sister    Cancer Other        lymphoma   Heart disease Sister    Hyperlipidemia Sister      Social History   Tobacco Use   Smoking status: Former    Types: Cigarettes    Quit date: 05/14/2012    Years since quitting: 9.0   Smokeless tobacco: Never   Tobacco comments:    uses E cig daily  Substance Use Topics   Drug use: No      ROS Constitutional: Denies fever, chills, weight loss/gain, headaches, insomnia,  night sweats or change in appetite. Does c/o fatigue. Eyes: Denies redness, blurred vision, diplopia, discharge, itchy or watery eyes.  ENT: Denies discharge,  congestion, post nasal drip, epistaxis, sore throat, earache, hearing loss, dental pain, Tinnitus, Vertigo, Sinus pain or snoring.  Cardio: Denies chest pain, palpitations, irregular heartbeat, syncope, dyspnea, diaphoresis, orthopnea, PND, claudication or edema Respiratory: denies cough, dyspnea, DOE, pleurisy, hoarseness, laryngitis or wheezing.  Gastrointestinal: Denies dysphagia, heartburn, reflux, water brash, pain, cramps, nausea, vomiting, bloating, diarrhea, constipation, hematemesis, melena, hematochezia, jaundice or hemorrhoids Genitourinary: Denies dysuria, frequency, urgency, nocturia, hesitancy, discharge, hematuria or flank pain Musculoskeletal: Denies arthralgia, myalgia, stiffness, Jt. Swelling, pain, limp or strain/sprain. Denies Falls. Skin: Denies puritis, rash, hives, warts, acne, eczema or change in skin lesion Neuro: No weakness, tremor, incoordination, spasms, paresthesia or pain Psychiatric: Denies confusion, memory loss or sensory loss. Denies Depression. Endocrine: Denies change in weight, skin, hair change, nocturia, and paresthesia, diabetic polys, visual blurring or hyper / hypo glycemic episodes.  Heme/Lymph: No excessive bleeding, bruising or enlarged lymph nodes.   Physical Exam  BP 110/70   Pulse 91   Temp 97.9 F (36.6 C)   Resp 16   Ht 5\' 11"  (1.803 m)   Wt 178 lb 12.8 oz (81.1 kg)   SpO2 98%   BMI 24.94 kg/m   General Appearance: Well nourished and well groomed and in no apparent distress.  Eyes: PERRLA, EOMs, conjunctiva no swelling or erythema, normal fundi and vessels. Sinuses: No frontal/maxillary tenderness ENT/Mouth: EACs patent / TMs  nl. Nares clear without erythema, swelling, mucoid exudates. Oral hygiene is good. No erythema, swelling, or exudate. Tongue normal, non-obstructing. Tonsils not swollen or erythematous. Hearing normal.  Neck: Supple, thyroid not palpable. No bruits, nodes or JVD. Respiratory: Respiratory effort normal.  BS  equal and clear bilateral without rales, rhonci, wheezing or stridor. Cardio: Heart sounds are normal with regular rate and rhythm and no murmurs, rubs or gallops. Peripheral pulses are normal and equal bilaterally without edema. No aortic or femoral bruits. Chest: symmetric with normal excursions and percussion.  Abdomen: Soft, with Nl bowel sounds. Nontender, no guarding, rebound, hernias, masses, or organomegaly.  Lymphatics: Non tender without lymphadenopathy.  Musculoskeletal: Full ROM all peripheral extremities, joint stability, 5/5 strength, and normal gait. Skin: Warm and dry without rashes, lesions, cyanosis, clubbing or  ecchymosis.  Neuro: Cranial nerves intact, reflexes equal bilaterally. Normal muscle tone, no cerebellar symptoms. Sensation intact.  Pysch: Alert and oriented X 3 with normal affect, insight and judgment appropriate.  Assessment and Plan  1. Annual Preventative/Screening Exam    2. Labile hypertension  - EKG 12-Lead - [place], RETROPERITNL ABD,  LTD - Urinalysis, Routine w reflex microscopic - Microalbumin / creatinine urine ratio - CBC with Differential/Platelet - COMPLETE METABOLIC PANEL WITH GFR - Magnesium - TSH  3. Hyperlipidemia, mixed  - EKG 12-Lead - [place], RETROPERITNL ABD,  LTD - Lipid panel - TSH  4. Abnormal glucose  - EKG 12-Lead - [place], RETROPERITNL ABD,  LTD - Hemoglobin A1c - Insulin, random  5. Vitamin D deficiency  - VITAMIN D 25 Hydroxy   6. BPH with obstruction/lower urinary tract symptoms  - PSA  7. Insulin resistance  - Hemoglobin A1c - Insulin, random  8. Screening for colorectal cancer  - POC Hemoccult Bld/Stl   9. Prostate cancer screening  - PSA  10. Screening for ischemic heart disease  - EKG 12-Lead  11. FHx: heart disease  - EKG 12-Lead - [place], RETROPERITNL ABD,  LTD  12. Former smoker  - EKG 12-Lead - [place], RETROPERITNL ABD,  LTD  13. Screening for AAA (aortic abdominal aneurysm)  - [place],  RETROPERITNL ABD,  LTD  14. Medication management  - Urinalysis, Routine w reflex microscopic - Microalbumin / creatinine urine ratio - CBC with Differential/Platelet - COMPLETE METABOLIC PANEL WITH GFR - Magnesium - Lipid panel - TSH - Hemoglobin A1c - Insulin, random - VITAMIN D 25 Hydroxy           Patient was counseled in prudent diet, weight control to achieve/maintain BMI less than 25, BP monitoring, regular exercise and medications as discussed.  Discussed med effects and SE's. Routine screening labs and tests as requested with regular follow-up as recommended. Over 40 minutes of exam, counseling, chart review and high complex critical decision making was performed   Kirtland Bouchard, MD

## 2021-05-20 NOTE — Patient Instructions (Signed)

## 2021-05-21 LAB — CBC WITH DIFFERENTIAL/PLATELET
Absolute Monocytes: 558 cells/uL (ref 200–950)
Basophils Absolute: 90 cells/uL (ref 0–200)
Basophils Relative: 1.1 %
Eosinophils Absolute: 221 cells/uL (ref 15–500)
Eosinophils Relative: 2.7 %
HCT: 51 % — ABNORMAL HIGH (ref 38.5–50.0)
Hemoglobin: 17.8 g/dL — ABNORMAL HIGH (ref 13.2–17.1)
Lymphs Abs: 2575 cells/uL (ref 850–3900)
MCH: 30 pg (ref 27.0–33.0)
MCHC: 34.9 g/dL (ref 32.0–36.0)
MCV: 86 fL (ref 80.0–100.0)
MPV: 10.3 fL (ref 7.5–12.5)
Monocytes Relative: 6.8 %
Neutro Abs: 4756 cells/uL (ref 1500–7800)
Neutrophils Relative %: 58 %
Platelets: 308 10*3/uL (ref 140–400)
RBC: 5.93 10*6/uL — ABNORMAL HIGH (ref 4.20–5.80)
RDW: 12.4 % (ref 11.0–15.0)
Total Lymphocyte: 31.4 %
WBC: 8.2 10*3/uL (ref 3.8–10.8)

## 2021-05-21 LAB — COMPLETE METABOLIC PANEL WITH GFR
AG Ratio: 1.9 (calc) (ref 1.0–2.5)
ALT: 12 U/L (ref 9–46)
AST: 13 U/L (ref 10–35)
Albumin: 4.5 g/dL (ref 3.6–5.1)
Alkaline phosphatase (APISO): 86 U/L (ref 35–144)
BUN: 11 mg/dL (ref 7–25)
CO2: 27 mmol/L (ref 20–32)
Calcium: 10.2 mg/dL (ref 8.6–10.3)
Chloride: 102 mmol/L (ref 98–110)
Creat: 1.3 mg/dL (ref 0.70–1.35)
Globulin: 2.4 g/dL (calc) (ref 1.9–3.7)
Glucose, Bld: 115 mg/dL (ref 65–139)
Potassium: 4.5 mmol/L (ref 3.5–5.3)
Sodium: 139 mmol/L (ref 135–146)
Total Bilirubin: 0.8 mg/dL (ref 0.2–1.2)
Total Protein: 6.9 g/dL (ref 6.1–8.1)
eGFR: 60 mL/min/{1.73_m2} (ref >=60)

## 2021-05-21 LAB — LIPID PANEL
Cholesterol: 172 mg/dL (ref <200)
HDL: 32 mg/dL — ABNORMAL LOW (ref >=40)
LDL Cholesterol (Calc): 89 mg/dL (calc)
Non-HDL Cholesterol (Calc): 140 mg/dL (calc) — ABNORMAL HIGH (ref <130)
Total CHOL/HDL Ratio: 5.4 (calc) — ABNORMAL HIGH (ref <5.0)
Triglycerides: 381 mg/dL — ABNORMAL HIGH (ref <150)

## 2021-05-21 LAB — MICROALBUMIN / CREATININE URINE RATIO
Creatinine, Urine: 150 mg/dL (ref 20–320)
Microalb Creat Ratio: 5 mcg/mg creat (ref <30)
Microalb, Ur: 0.8 mg/dL

## 2021-05-21 LAB — MAGNESIUM: Magnesium: 2.4 mg/dL (ref 1.5–2.5)

## 2021-05-21 LAB — PSA: PSA: 0.71 ng/mL (ref <=4.00)

## 2021-05-21 LAB — URINALYSIS, ROUTINE W REFLEX MICROSCOPIC
Bilirubin Urine: NEGATIVE
Glucose, UA: NEGATIVE
Hgb urine dipstick: NEGATIVE
Ketones, ur: NEGATIVE
Leukocytes,Ua: NEGATIVE
Nitrite: NEGATIVE
Protein, ur: NEGATIVE
Specific Gravity, Urine: 1.019 (ref 1.001–1.035)
pH: 7.5 (ref 5.0–8.0)

## 2021-05-21 LAB — HEMOGLOBIN A1C
Hgb A1c MFr Bld: 5.2 % of total Hgb (ref <5.7)
Mean Plasma Glucose: 103 mg/dL
eAG (mmol/L): 5.7 mmol/L

## 2021-05-21 LAB — VITAMIN D 25 HYDROXY (VIT D DEFICIENCY, FRACTURES): Vit D, 25-Hydroxy: 79 ng/mL (ref 30–100)

## 2021-05-21 LAB — TSH: TSH: 0.81 mIU/L (ref 0.40–4.50)

## 2021-05-21 LAB — INSULIN, RANDOM: Insulin: 122.8 u[IU]/mL — ABNORMAL HIGH

## 2021-05-22 NOTE — Progress Notes (Signed)
============================================================ -   Test results slightly outside the reference range are not unusual. If there is anything important, I will review this with you,  otherwise it is considered normal test values.  If you have further questions,  please do not hesitate to contact me at the office or via My Chart.  ============================================================ ============================================================  -  PSA - Low - Great   ! ============================================================ ============================================================  -  Total Chol = 172     &   LDL   Chol   = 89  Both  Excellent   - Very low risk for Heart Attack  / Stroke ============================================================ ============================================================  -  But,  Triglycerides (    381     ) or fats in blood are too high  (goal is less than 150)    - Recommend avoid fried & greasy foods,  sweets / candy,   - Avoid white rice  (brown or wild rice or Quinoa is OK),   - Avoid white potatoes  (sweet potatoes are OK)   - Avoid anything made from white flour  - bagels, doughnuts, rolls, buns, biscuits, white and   wheat breads, pizza crust and traditional  pasta made of white flour & egg white  - (vegetarian pasta or spinach or wheat pasta is OK).    - Multi-grain bread is OK - like multi-grain flat bread or  sandwich thins.   - Avoid alcohol in excess.   - Exercise is also important. ============================================================ ============================================================  -  A1c  - Normal;  -  No Diabetes  -  Great   ! ============================================================ ============================================================  - But Insulin Level = 122.8 is elevated  (  Nl < 20  ) & shows insulin resistance   - a sign of early diabetes and  associated with a 300 % greater risk for   heart attacks, strokes, cancer & Alzheimer type vascular dementia   - All this can be cured  and prevented with losing weight   - get Dr Fara Olden Fuhrman's book 'the End of Diabetes" and "the End of Dieting" &   add many years of good health to your life. ============================================================ ============================================================  -  Vitamin D = 69 - Excellent  - Please keep dosing same  ============================================================ ============================================================  -  All Else - CBC - Kidneys - Electrolytes - Liver - Magnesium & Thyroid    - all  Normal / OK ============================================================ ============================================================

## 2021-05-23 ENCOUNTER — Encounter: Payer: Self-pay | Admitting: Internal Medicine

## 2021-05-31 ENCOUNTER — Other Ambulatory Visit: Payer: Self-pay

## 2021-05-31 DIAGNOSIS — Z1211 Encounter for screening for malignant neoplasm of colon: Secondary | ICD-10-CM | POA: Diagnosis not present

## 2021-05-31 DIAGNOSIS — Z1212 Encounter for screening for malignant neoplasm of rectum: Secondary | ICD-10-CM | POA: Diagnosis not present

## 2021-05-31 LAB — POC HEMOCCULT BLD/STL (HOME/3-CARD/SCREEN)
Card #2 Fecal Occult Blod, POC: NEGATIVE
Card #3 Fecal Occult Blood, POC: NEGATIVE
Fecal Occult Blood, POC: NEGATIVE

## 2021-08-23 NOTE — Progress Notes (Signed)
MEDICARE ANNUAL WELLNESS VISIT AND 3 MONTH FOLLOW UP  Assessment:   Diagnoses and all orders for this visit:  Medicare annual wellness visit, subsequent Yearly   Elevated blood pressure reading without diagnosis of hypertension Discussed dietary and exercise modifications, currently controlled without medications Discussed DASH diet.   Reminder to go to the ER if any CP, SOB, nausea, dizziness, severe HA, changes vision/speech, left arm numbness and tingling and jaw pain. - CBC  Hyperlipidemia Controlled with diet and exercise - Lipid panel - CMP  Hypogonadism male Asymptomatic - Testosterone  Nephrolithiasis History of Asymptomatic Continue to monitor Increase fluid intake  BPH with LUTS Doing well at this time  No medications Continue to monitor  Anemia, unspecified type Asymptomatic Will check labs next office visit  Vitamin D deficiency Takes Vitamin D3 10,000IU daily Discussed supplementation Will check labs next office visit  BMI 24.0-24.9 Discussed dietary and exercise modifications Down 10lbs from last OV  Medication Management Magnesium   I provided 30 minutes of  face-to-face time during this encounter including counseling, chart review, and critical decision making was preformed.   Future Appointments  Date Time Provider [place]  05/23/2022  2:00 PM Unk Pinto, MD [facility] None  08/25/2022  3:00 PM Magda Bernheim, NP [facility] None      Plan:   During the course of the visit the patient was educated and counseled about appropriate screening and preventive services including:   Pneumococcal vaccine  Influenza vaccine Prevnar 13 Td vaccine Colorectal cancer screening Diabetes screening Glaucoma screening Nutrition counseling    Subjective:  Adam Hammond is a 71 y.o. male who presents for Medicare Annual Wellness Visit.  He has history of elevated blood pressure without diagnosis of HTN, nephrolithiasis,  hypogonadism, anemia and low back pain.  Today he reports that he is doing well overall.  He does not have any health or medication concerns today.   He does exercise 7 days a week by walking his dog  He does this for 45 min every day.  Body mass index is 24.94 kg/m. BMI is Body mass index is 24.94 kg/m., he has been working on diet and exercise. Wt Readings from Last 3 Encounters:  08/24/21 178 lb 12.8 oz (81.1 kg)  05/20/21 178 lb 12.8 oz (81.1 kg)  08/24/20 179 lb (81.2 kg)    He does workout by means of walking his dogs and yard work. He denies chest pain, shortness of breath, dizziness.  He is not on cholesterol medication and denies myalgias. His cholesterol is not at goal but last check was over three years ago. He does still eat white bread and white rice as well as sweet tea. The cholesterol last visit was:   Lab Results  Component Value Date   CHOL 172 05/20/2021   HDL 32 (L) 05/20/2021   LDLCALC 89 05/20/2021   TRIG 381 (H) 05/20/2021   CHOLHDL 5.4 (H) 05/20/2021   He has not been working on diet and exercise for prediabetes, and denies hyperglycemia, hypoglycemia , increased appetite, nausea, paresthesia of the feet, polydipsia, polyuria, visual disturbances, vomiting and weight loss. Last A1C in the office was:  Lab Results  Component Value Date   HGBA1C 5.4 04/16/2019   Lab Results  Component Value Date   EGFR 60 05/20/2021      Patient is on not Vitamin D supplement.   Lab Results  Component Value Date   VD25OH 25 (L) 04/16/2019    Medication Review:  Current Outpatient Medications (Analgesics):    aspirin EC 81 MG tablet, Take 81 mg by mouth daily. Swallow whole.   Current Outpatient Medications (Other):    VITAMIN D PO, Take by mouth. Takes occasionally  Allergies: No Known Allergies  Current Problems (verified) has Nephrolithiasis; BCC (basal cell carcinoma), ear; Elevated BP without diagnosis of hypertension; Hypogonadism male; Labile  hypertension; Abnormal glucose; Vitamin D deficiency; Screening for colorectal cancer; and BPH with obstruction/lower urinary tract symptoms on their problem list.  Screening Tests Immunization History  Administered Date(s) Administered   DTaP 05/20/2006   Influenza, High Dose Seasonal PF 05/20/2021   PFIZER(Purple Top)SARS-COV-2 Vaccination 08/15/2019, 09/10/2019   PPD Test 07/17/2013   Pfizer Covid-19 Vaccine Bivalent Booster 1yr & up 06/19/2020   Td 05/20/2006   Tdap 08/19/2014    Preventative care: Last colonoscopy: Continues to decline, negative hemoccult x 3 05/31/21  Prior vaccinations: TD or Tdap: 2016  Influenza: 05/2021 Pneumococcal: Declines Prevnar13: Declines Shingles/Zostavax: Declines Shingrix, DUE COVID- Next week second shot.    Names of Other Physician/Practitioners you currently use: 1. [facility] here for primary care 2. Eye Exam, 2019 Plans to schedule appt with DR. SGershon Crane3. Dental Exam:2019  DUE for 2023  CXR 2014 DERM: 2016, WNL  Patient Care Team: MUnk Pinto MD as PCP - General (Internal Medicine)  Surgical: He  has a past surgical history that includes Shoulder surgery (Left, 1997); Wrist surgery (Left, 1997); and Mohs surgery (Left, 2014). Family His family history includes Alcohol abuse in his father; Cancer in his father, mother, and another family member; Diabetes in his sister; Heart attack in his father and sister; Heart disease in his father, sister, and sister; Hyperlipidemia in his father, mother, sister, and sister; Hypertension in his mother. Social history  He reports that he quit smoking about 9 years ago. His smoking use included cigarettes. He has never used smokeless tobacco. He reports that he does not use drugs. No history on file for alcohol use.  MEDICARE WELLNESS OBJECTIVES: Physical activity: Current Exercise Habits: Home exercise routine, Type of exercise: walking, Time  (Minutes): 45, Frequency (Times/Week): 7, Weekly Exercise (Minutes/Week): 315, Intensity: Mild, Exercise limited by: None identified Cardiac risk factors: Cardiac Risk Factors include: advanced age (>544m, >6>45omen);dyslipidemia;male gender Depression/mood screen:   [facility] 08/24/2021  Decreased Interest 0  Down, Depressed, Hopeless 0  PHQ - 2 Score 0    ADLs:  In your present state of health, do you have any difficulty performing the following activities: 08/24/2021 05/20/2021  Hearing? N N  Vision? N N  Difficulty concentrating or making decisions? N N  Walking or climbing stairs? N N  Dressing or bathing? N N  Doing errands, shopping? N N  Preparing Food and eating ? - -  Using the Toilet? - -  In the past six months, have you accidently leaked urine? - -  Do you have problems with loss of bowel control? - -  Managing your Medications? - -  Managing your Finances? - -  Housekeeping or managing your Housekeeping? - -  Some recent data might be hidden     Cognitive Testing  Alert? Yes  Normal Appearance?Yes  Oriented to person? Yes  Place? Yes   Time? Yes  Recall of three objects?  Yes  Can perform simple calculations? Yes  Displays appropriate judgment?Yes  Can read the correct time from a watch face?Yes  EOL planning: Does Patient Have a Medical Advance Directive?:  No Would patient like information on creating a medical advance directive?: No - Patient declined   Objective:   Today's Vitals   08/24/21 1435  BP: 126/72  Pulse: 64  Temp: 97.7 F (36.5 C)  SpO2: 97%  Weight: 178 lb 12.8 oz (81.1 kg)    Body mass index is 24.94 kg/m.  General Appearance: Well nourished, in no apparent distress. Eyes: PERRLA, EOMs, conjunctiva no swelling or erythema Sinuses: No Frontal/maxillary tenderness ENT/Mouth: Ext aud canals clear, TMs without erythema, bulging. No erythema, swelling, or exudate on post pharynx.  Tonsils not swollen or erythematous.  Hearing normal.  Neck: Supple, thyroid normal.  Respiratory: Respiratory effort normal, BS equal bilaterally without rales, rhonchi, wheezing or stridor.  Cardio: RRR with no MRGs. Brisk peripheral pulses without edema.  Abdomen: Soft, + BS.  Non tender, no guarding, rebound, hernias, masses. Lymphatics: Non tender without lymphadenopathy.  Musculoskeletal: Full ROM, 5/5 strength, Normal gait Skin: Warm, dry without rashes, lesions, ecchymosis.  Neuro: Cranial nerves intact. No cerebellar symptoms.  Psych: Awake and oriented X 3, normal affect, Insight and Judgment appropriate.    Medicare Attestation I have personally reviewed: The patient's medical and social history Their use of alcohol, tobacco or illicit drugs Their current medications and supplements The patient's functional ability including ADLs,fall risks, home safety risks, cognitive, and hearing and visual impairment Diet and physical activities Evidence for depression or mood disorders  The patient's weight, height, BMI, and visual acuity have been recorded in the chart.  I have made referrals, counseling, and provided education to the patient based on review of the above and I have provided the patient with a written personalized care plan for preventive services.    Magda Bernheim ANP-C  [facility]  08/24/2021

## 2021-08-24 ENCOUNTER — Other Ambulatory Visit: Payer: Self-pay

## 2021-08-24 ENCOUNTER — Ambulatory Visit: Payer: PPO | Admitting: Adult Health Nurse Practitioner

## 2021-08-24 ENCOUNTER — Encounter: Payer: Self-pay | Admitting: Nurse Practitioner

## 2021-08-24 ENCOUNTER — Ambulatory Visit (INDEPENDENT_AMBULATORY_CARE_PROVIDER_SITE_OTHER): Payer: PPO | Admitting: Nurse Practitioner

## 2021-08-24 VITALS — BP 126/72 | HR 64 | Temp 97.7°F | Wt 178.8 lb

## 2021-08-24 DIAGNOSIS — R7309 Other abnormal glucose: Secondary | ICD-10-CM | POA: Diagnosis not present

## 2021-08-24 DIAGNOSIS — N138 Other obstructive and reflux uropathy: Secondary | ICD-10-CM | POA: Diagnosis not present

## 2021-08-24 DIAGNOSIS — D649 Anemia, unspecified: Secondary | ICD-10-CM | POA: Diagnosis not present

## 2021-08-24 DIAGNOSIS — R6889 Other general symptoms and signs: Secondary | ICD-10-CM | POA: Diagnosis not present

## 2021-08-24 DIAGNOSIS — E291 Testicular hypofunction: Secondary | ICD-10-CM | POA: Diagnosis not present

## 2021-08-24 DIAGNOSIS — N200 Calculus of kidney: Secondary | ICD-10-CM | POA: Diagnosis not present

## 2021-08-24 DIAGNOSIS — E559 Vitamin D deficiency, unspecified: Secondary | ICD-10-CM

## 2021-08-24 DIAGNOSIS — N401 Enlarged prostate with lower urinary tract symptoms: Secondary | ICD-10-CM

## 2021-08-24 DIAGNOSIS — Z79899 Other long term (current) drug therapy: Secondary | ICD-10-CM | POA: Diagnosis not present

## 2021-08-24 DIAGNOSIS — Z0000 Encounter for general adult medical examination without abnormal findings: Secondary | ICD-10-CM

## 2021-08-24 DIAGNOSIS — R030 Elevated blood-pressure reading, without diagnosis of hypertension: Secondary | ICD-10-CM

## 2021-08-24 DIAGNOSIS — Z0001 Encounter for general adult medical examination with abnormal findings: Secondary | ICD-10-CM | POA: Diagnosis not present

## 2021-08-24 DIAGNOSIS — E782 Mixed hyperlipidemia: Secondary | ICD-10-CM

## 2021-08-25 LAB — COMPLETE METABOLIC PANEL WITH GFR
AG Ratio: 1.8 (calc) (ref 1.0–2.5)
ALT: 11 U/L (ref 9–46)
AST: 14 U/L (ref 10–35)
Albumin: 4.6 g/dL (ref 3.6–5.1)
Alkaline phosphatase (APISO): 78 U/L (ref 35–144)
BUN: 10 mg/dL (ref 7–25)
CO2: 26 mmol/L (ref 20–32)
Calcium: 9.9 mg/dL (ref 8.6–10.3)
Chloride: 102 mmol/L (ref 98–110)
Creat: 1.2 mg/dL (ref 0.70–1.35)
Globulin: 2.6 g/dL (calc) (ref 1.9–3.7)
Glucose, Bld: 85 mg/dL (ref 65–99)
Potassium: 4.7 mmol/L (ref 3.5–5.3)
Sodium: 138 mmol/L (ref 135–146)
Total Bilirubin: 0.8 mg/dL (ref 0.2–1.2)
Total Protein: 7.2 g/dL (ref 6.1–8.1)
eGFR: 65 mL/min/{1.73_m2} (ref >=60)

## 2021-08-25 LAB — CBC WITH DIFFERENTIAL/PLATELET
Absolute Monocytes: 616 cells/uL (ref 200–950)
Basophils Absolute: 77 cells/uL (ref 0–200)
Basophils Relative: 1 %
Eosinophils Absolute: 193 cells/uL (ref 15–500)
Eosinophils Relative: 2.5 %
HCT: 50.9 % — ABNORMAL HIGH (ref 38.5–50.0)
Hemoglobin: 17.4 g/dL — ABNORMAL HIGH (ref 13.2–17.1)
Lymphs Abs: 2441 cells/uL (ref 850–3900)
MCH: 29.3 pg (ref 27.0–33.0)
MCHC: 34.2 g/dL (ref 32.0–36.0)
MCV: 85.8 fL (ref 80.0–100.0)
MPV: 9.6 fL (ref 7.5–12.5)
Monocytes Relative: 8 %
Neutro Abs: 4374 cells/uL (ref 1500–7800)
Neutrophils Relative %: 56.8 %
Platelets: 287 10*3/uL (ref 140–400)
RBC: 5.93 10*6/uL — ABNORMAL HIGH (ref 4.20–5.80)
RDW: 12.7 % (ref 11.0–15.0)
Total Lymphocyte: 31.7 %
WBC: 7.7 10*3/uL (ref 3.8–10.8)

## 2021-08-25 LAB — LIPID PANEL
Cholesterol: 159 mg/dL (ref <200)
HDL: 33 mg/dL — ABNORMAL LOW (ref >=40)
LDL Cholesterol (Calc): 106 mg/dL (calc) — ABNORMAL HIGH
Non-HDL Cholesterol (Calc): 126 mg/dL (calc) (ref <130)
Total CHOL/HDL Ratio: 4.8 (calc) (ref <5.0)
Triglycerides: 106 mg/dL (ref <150)

## 2021-08-25 LAB — TESTOSTERONE: Testosterone: 458 ng/dL (ref 250–827)

## 2021-08-25 LAB — MAGNESIUM: Magnesium: 2.3 mg/dL (ref 1.5–2.5)

## 2021-09-15 ENCOUNTER — Encounter: Payer: Self-pay | Admitting: Nurse Practitioner

## 2022-05-22 ENCOUNTER — Encounter: Payer: Self-pay | Admitting: Internal Medicine

## 2022-05-22 NOTE — Progress Notes (Signed)
Annual  Screening/Preventative Visit  & Comprehensive Evaluation & Examination   Future Appointments  Date Time Provider Department  05/23/2022                           cpe  2:00 PM Unk Pinto, MD [facility]  08/25/2022                             wellness  3:00 PM Alycia Rossetti, NP [facility]  05/30/2023  2:00 PM Unk Pinto, MD [facility]            This very nice 71 y.o. MWM presents for a Screening /Preventative Visit & comprehensive evaluation and management of multiple medical co-morbidities.  Patient has been followed for HTN, HLD, Prediabetes and Vitamin D Deficiency.        Labile HTN predates since  2007. Patient's BP has been controlled and today's BP is at goal -  108/70 . Patient denies any cardiac symptoms as chest pain, palpitations, shortness of breath, dizziness or ankle swelling.        Patient's hyperlipidemia is  not controlled with diet. Last lipids were not  at goal :  Lab Results  Component Value Date   CHOL 159 08/24/2021   HDL 33 (L) 08/24/2021   LDLCALC 106 (H) 08/24/2021   TRIG 106 08/24/2021   CHOLHDL 4.8 08/24/2021         Patient is monitored expectantly for glucose intolerance & prediabetes. 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 Last A1c was normal :   Lab Results  Component Value Date   HGBA1C 5.3 05/05/2020   Insulin = 122.8    In Dec 2022  ( Nl < 20 )     Wt Readings from Last 3 Encounters:  05/20/21 178 lb 12.8 oz (81.1 kg)  08/24/20 179 lb (81.2 kg)  05/05/20 189 lb 3.2 oz (85.8 kg)          Finally, patient has history of Vitamin D Deficiency ("28" /2015) and last vitamin D was borderline elevated :   Lab Results  Component Value Date   VD25OH 105 (H) 08/24/2020     Current Outpatient Medications:     aspirin EC 81 MG tablet,                Take  daily   VITAMIN D ,                                  Takes occasionally    No Known Allergies       Past Medical History:  Diagnosis Date   Actinic keratitis    Anemia    BCC (basal cell carcinoma), ear    left ear   Elevated blood pressure reading without diagnosis of hypertension    Hypogonadism male    Nephrolithiasis 2006     Health Maintenance  Topic Date Due   Pneumonia Vaccine 29+ Years old (1 - PCV) Never done   Zoster Vaccines- Shingrix (1 of 2) Never done   COLONOSCOPY  Never done   COVID-19 Vaccine (3 - Pfizer risk series) 10/08/2019   INFLUENZA VACCINE  Never done   TETANUS/TDAP  08/18/2024   Hepatitis C Screening  Completed  HPV VACCINES  Aged Out     Immunization History  Administered Date(s) Administered   DTaP 05/20/2006   PFIZER SARS-COV-2 Vacc 08/15/2019, 09/10/2019   PPD Test 07/17/2013   Td 05/20/2006   Tdap 08/19/2014    Last Colon - Never   Past Surgical History:  Procedure Laterality Date   MOHS SURGERY Left 2014   ear BCC   SHOULDER SURGERY Left 1997   WRIST SURGERY Left 1997     Family History  Problem Relation Age of Onset   Hypertension Mother    Cancer Mother        metastatic melanoma   Hyperlipidemia Mother    Heart attack Father    Alcohol abuse Father    Heart disease Father    Hyperlipidemia Father    Cancer Father        throat   Diabetes Sister        juvenile   Heart attack Sister    Heart disease Sister    Hyperlipidemia Sister    Cancer Other        lymphoma   Heart disease Sister    Hyperlipidemia Sister      Social History   Tobacco Use   Smoking status: Former    Types: Cigarettes    Quit date: 05/14/2012    Years since quitting: 9.0   Smokeless tobacco: Never   Tobacco comments:    uses E cig daily  Substance Use Topics   Drug use: No      ROS Constitutional: Denies fever, chills, weight loss/gain, headaches, insomnia,  night sweats or change in appetite. Does c/o fatigue. Eyes: Denies redness, blurred vision,  diplopia, discharge, itchy or watery eyes.  ENT: Denies discharge, congestion, post nasal drip, epistaxis, sore throat, earache, hearing loss, dental pain, Tinnitus, Vertigo, Sinus pain or snoring.  Cardio: Denies chest pain, palpitations, irregular heartbeat, syncope, dyspnea, diaphoresis, orthopnea, PND, claudication or edema Respiratory: denies cough, dyspnea, DOE, pleurisy, hoarseness, laryngitis or wheezing.  Gastrointestinal: Denies dysphagia, heartburn, reflux, water brash, pain, cramps, nausea, vomiting, bloating, diarrhea, constipation, hematemesis, melena, hematochezia, jaundice or hemorrhoids Genitourinary: Denies dysuria, frequency,  discharge, hematuria or flank pain. Has urgency, nocturia x 2-3 & occasional hesitancy. Musculoskeletal: Denies arthralgia, myalgia, stiffness, Jt. Swelling, pain, limp or strain/sprain. Denies Falls. Skin: Denies puritis, rash, hives, warts, acne, eczema or change in skin lesion Neuro: No weakness, tremor, incoordination, spasms, paresthesia or pain Psychiatric: Denies confusion, memory loss or sensory loss. Denies Depression. Endocrine: Denies change in weight, skin, hair change, nocturia, and paresthesia, diabetic polys, visual blurring or hyper / hypo glycemic episodes.  Heme/Lymph: No excessive bleeding, bruising or enlarged lymph nodes.   Physical Exam  BP 108/70   Pulse 62   Temp 97.6 F (36.4 C)   Resp 16   Ht '5\' 11"'$  (1.803 m)   Wt 165 lb 9.6 oz (75.1 kg)   SpO2 98%   BMI 23.10 kg/m   General Appearance: Well nourished and well groomed and in no apparent distress.  Eyes: PERRLA, EOMs, conjunctiva no swelling or erythema, normal fundi and vessels. Sinuses: No frontal/maxillary tenderness ENT/Mouth: EACs patent / TMs  nl. Nares clear without erythema, swelling, mucoid exudates. Oral hygiene is good. No erythema, swelling, or exudate. Tongue normal, non-obstructing. Tonsils not swollen or erythematous. Hearing normal.  Neck: Supple,  thyroid not palpable. No bruits, nodes or JVD. Respiratory: Respiratory effort normal.  BS equal and clear bilateral without rales, rhonci, wheezing or stridor. Cardio: Heart sounds are normal  with regular rate and rhythm and no murmurs, rubs or gallops. Peripheral pulses are normal and equal bilaterally without edema. No aortic or femoral bruits. Chest: symmetric with normal excursions and percussion.  Abdomen: Soft, with Nl bowel sounds. Nontender, no guarding, rebound, hernias, masses, or organomegaly.  Lymphatics: Non tender without lymphadenopathy.  Musculoskeletal: Full ROM all peripheral extremities, joint stability, 5/5 strength, and normal gait. Skin: Warm and dry without rashes, lesions, cyanosis, clubbing or  ecchymosis.  Neuro: Cranial nerves intact, reflexes equal bilaterally. Normal muscle tone, no cerebellar symptoms. Sensation intact.  Pysch: Alert and oriented x 3 with normal affect, insight and judgment appropriate.   Assessment and Plan  1. Annual Preventative/Screening Exam    2. Labile Hypertension  - EKG 12-Lead - [place], RETROPERITNL ABD,  LTD - Urinalysis, Routine w reflex microscopic - Microalbumin / creatinine urine ratio - CBC with Differential/Platelet - COMPLETE METABOLIC PANEL WITH GFR - Magnesium - TSH  3. Hyperlipidemia, mixed  - EKG 12-Lead - [place], RETROPERITNL ABD,  LTD - Lipid panel - TSH  4. Abnormal glucose  - EKG 12-Lead - [place], RETROPERITNL ABD,  LTD - Hemoglobin A1c - Insulin, random  5. Vitamin D deficiency  - VITAMIN D 25 Hydroxy  6. Insulin resistance  - EKG 12-Lead - [place], RETROPERITNL ABD,  LTD - Hemoglobin A1c - Insulin, random  7. BPH with obstruction/lower urinary tract symptoms  - PSA  8. Screening for colorectal cancer  - Cologuard  9. Prostate cancer screening  - PSA  10. Screening for heart disease  - EKG 12-Lead  11. FHx: heart disease  - EKG 12-Lead - [place], RETROPERITNL ABD,  LTD  12. Former smoker  -  EKG 12-Lead - [place], RETROPERITNL ABD,  LTD  13. Screening for AAA (aortic abdominal aneurysm)  - [place], RETROPERITNL ABD,  LTD  14. Medication management  - Urinalysis, Routine w reflex microscopic - Microalbumin / creatinine urine ratio - CBC with Differential/Platelet - COMPLETE METABOLIC PANEL WITH GFR - Magnesium - Lipid panel - TSH - Hemoglobin A1c - Insulin, random - VITAMIN D 25 Hydroxy            Patient was counseled in prudent diet, weight control to achieve/maintain BMI less than 25, BP monitoring, regular exercise and medications as discussed.  Discussed med effects and SE's. Routine screening labs and tests as requested with regular follow-up as recommended. Over 40 minutes of exam, counseling, chart review and high complex critical decision making was performed   Kirtland Bouchard, MD

## 2022-05-22 NOTE — Patient Instructions (Signed)

## 2022-05-22 NOTE — Progress Notes (Incomplete)
Annual  Screening/Preventative Visit  & Comprehensive Evaluation & Examination   Future Appointments  Date Time Provider Department  05/23/2022                           cpe  2:00 PM Unk Pinto, MD [facility]  08/25/2022                             wellness  3:00 PM Alycia Rossetti, NP [facility]  05/30/2023  2:00 PM Unk Pinto, MD [facility]            This very nice 71 y.o. MWM presents for a Screening /Preventative Visit & comprehensive evaluation and management of multiple medical co-morbidities.  Patient has been followed for HTN, HLD, Prediabetes and Vitamin D Deficiency.       Labile HTN predates since  2007. Patient's BP has been controlled and today's BP is at goal -                         . Patient denies any cardiac symptoms as chest pain, palpitations, shortness of breath, dizziness or ankle swelling.       Patient's hyperlipidemia is controlled with diet and medications. Patient denies myalgias or other medication SE's. Last lipids were at goal with slightly elevated Trig's :  Lab Results  Component Value Date   CHOL 159 08/24/2021   HDL 33 (L) 08/24/2021   LDLCALC 106 (H) 08/24/2021   TRIG 106 08/24/2021   CHOLHDL 4.8 08/24/2021           Patient is monitored expectantly for glucose intolerance & prediabetes. 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 Last A1c was normal :   Lab Results  Component Value Date   HGBA1C 5.3 05/05/2020    Wt Readings from Last 3 Encounters:  05/20/21 178 lb 12.8 oz (81.1 kg)  08/24/20 179 lb (81.2 kg)  05/05/20 189 lb 3.2 oz (85.8 kg)          Finally, patient has history of Vitamin D Deficiency ("28" /2015) and last vitamin D was borderline elevated :   Lab Results  Component Value Date   VD25OH 105 (H) 08/24/2020     Current Outpatient Medications on File Prior to Visit   Medication Sig  . VITAMIN D PO Takes occasionally    No Known Allergies   Past Medical History:  Diagnosis Date  . Actinic keratitis   . Anemia   . BCC (basal cell carcinoma), ear    left ear  . Elevated blood pressure reading without diagnosis of hypertension   . Hypogonadism male   . Nephrolithiasis 2006     Health Maintenance  Topic Date Due  . Pneumonia Vaccine 38+ Years old (1 - PCV) Never done  . Zoster Vaccines- Shingrix (1 of 2) Never done  . COLONOSCOPY  Never done  . COVID-19 Vaccine (3 - Pfizer risk series) 10/08/2019  . INFLUENZA VACCINE  Never done  . TETANUS/TDAP  08/18/2024  . Hepatitis C Screening  Completed  . HPV VACCINES  Aged Out     Immunization History  Administered Date(s) Administered  . DTaP 05/20/2006  . PFIZER SARS-COV-2 Vacc 08/15/2019, 09/10/2019  . PPD Test 07/17/2013  . Td 05/20/2006  . Tdap 08/19/2014  Last Colon -   Past Surgical History:  Procedure Laterality Date  . MOHS SURGERY Left 2014   ear BCC  . SHOULDER SURGERY Left 1997  . WRIST SURGERY Left 1997     Family History  Problem Relation Age of Onset  . Hypertension Mother   . Cancer Mother        metastatic melanoma  . Hyperlipidemia Mother   . Heart attack Father   . Alcohol abuse Father   . Heart disease Father   . Hyperlipidemia Father   . Cancer Father        throat  . Diabetes Sister        juvenile  . Heart attack Sister   . Heart disease Sister   . Hyperlipidemia Sister   . Cancer Other        lymphoma  . Heart disease Sister   . Hyperlipidemia Sister      Social History   Tobacco Use  . Smoking status: Former    Types: Cigarettes    Quit date: 05/14/2012    Years since quitting: 9.0  . Smokeless tobacco: Never  . Tobacco comments:    uses E cig daily  Substance Use Topics  . Drug use: No      ROS Constitutional: Denies fever, chills, weight loss/gain, headaches, insomnia,  night sweats or change in appetite. Does c/o  fatigue. Eyes: Denies redness, blurred vision, diplopia, discharge, itchy or watery eyes.  ENT: Denies discharge, congestion, post nasal drip, epistaxis, sore throat, earache, hearing loss, dental pain, Tinnitus, Vertigo, Sinus pain or snoring.  Cardio: Denies chest pain, palpitations, irregular heartbeat, syncope, dyspnea, diaphoresis, orthopnea, PND, claudication or edema Respiratory: denies cough, dyspnea, DOE, pleurisy, hoarseness, laryngitis or wheezing.  Gastrointestinal: Denies dysphagia, heartburn, reflux, water brash, pain, cramps, nausea, vomiting, bloating, diarrhea, constipation, hematemesis, melena, hematochezia, jaundice or hemorrhoids Genitourinary: Denies dysuria, frequency, urgency, nocturia, hesitancy, discharge, hematuria or flank pain Musculoskeletal: Denies arthralgia, myalgia, stiffness, Jt. Swelling, pain, limp or strain/sprain. Denies Falls. Skin: Denies puritis, rash, hives, warts, acne, eczema or change in skin lesion Neuro: No weakness, tremor, incoordination, spasms, paresthesia or pain Psychiatric: Denies confusion, memory loss or sensory loss. Denies Depression. Endocrine: Denies change in weight, skin, hair change, nocturia, and paresthesia, diabetic polys, visual blurring or hyper / hypo glycemic episodes.  Heme/Lymph: No excessive bleeding, bruising or enlarged lymph nodes.   Physical Exam  There were no vitals taken for this visit.  General Appearance: Well nourished and well groomed and in no apparent distress.  Eyes: PERRLA, EOMs, conjunctiva no swelling or erythema, normal fundi and vessels. Sinuses: No frontal/maxillary tenderness ENT/Mouth: EACs patent / TMs  nl. Nares clear without erythema, swelling, mucoid exudates. Oral hygiene is good. No erythema, swelling, or exudate. Tongue normal, non-obstructing. Tonsils not swollen or erythematous. Hearing normal.  Neck: Supple, thyroid not palpable. No bruits, nodes or JVD. Respiratory: Respiratory effort  normal.  BS equal and clear bilateral without rales, rhonci, wheezing or stridor. Cardio: Heart sounds are normal with regular rate and rhythm and no murmurs, rubs or gallops. Peripheral pulses are normal and equal bilaterally without edema. No aortic or femoral bruits. Chest: symmetric with normal excursions and percussion.  Abdomen: Soft, with Nl bowel sounds. Nontender, no guarding, rebound, hernias, masses, or organomegaly.  Lymphatics: Non tender without lymphadenopathy.  Musculoskeletal: Full ROM all peripheral extremities, joint stability, 5/5 strength, and normal gait. Skin: Warm and dry without rashes, lesions, cyanosis, clubbing or  ecchymosis.  Neuro: Cranial nerves intact,  reflexes equal bilaterally. Normal muscle tone, no cerebellar symptoms. Sensation intact.  Pysch: Alert and oriented X 3 with normal affect, insight and judgment appropriate.   Assessment and Plan  1. Annual Preventative/Screening Exam    2. Labile hypertension  - EKG 12-Lead - [place], RETROPERITNL ABD,  LTD - Urinalysis, Routine w reflex microscopic - Microalbumin / creatinine urine ratio - CBC with Differential/Platelet - COMPLETE METABOLIC PANEL WITH GFR - Magnesium - TSH  3. Hyperlipidemia, mixed  - EKG 12-Lead - [place], RETROPERITNL ABD,  LTD - Lipid panel - TSH  4. Abnormal glucose  - EKG 12-Lead - [place], RETROPERITNL ABD,  LTD - Hemoglobin A1c - Insulin, random  5. Vitamin D deficiency  - VITAMIN D 25 Hydroxy   6. BPH with obstruction/lower urinary tract symptoms  - PSA  7. Insulin resistance  - Hemoglobin A1c - Insulin, random  8. Screening for colorectal cancer  - POC Hemoccult Bld/Stl   9. Prostate cancer screening  - PSA  10. Screening for ischemic heart disease  - EKG 12-Lead  11. FHx: heart disease  - EKG 12-Lead - [place], RETROPERITNL ABD,  LTD  12. Former smoker  - EKG 12-Lead - [place], RETROPERITNL ABD,  LTD  13. Screening for AAA (aortic abdominal aneurysm)  -  [place], RETROPERITNL ABD,  LTD  14. Medication management  - Urinalysis, Routine w reflex microscopic - Microalbumin / creatinine urine ratio - CBC with Differential/Platelet - COMPLETE METABOLIC PANEL WITH GFR - Magnesium - Lipid panel - TSH - Hemoglobin A1c - Insulin, random - VITAMIN D 25 Hydroxy           Patient was counseled in prudent diet, weight control to achieve/maintain BMI less than 25, BP monitoring, regular exercise and medications as discussed.  Discussed med effects and SE's. Routine screening labs and tests as requested with regular follow-up as recommended. Over 40 minutes of exam, counseling, chart review and high complex critical decision making was performed   Kirtland Bouchard, MD

## 2022-05-23 ENCOUNTER — Encounter: Payer: Self-pay | Admitting: Internal Medicine

## 2022-05-23 ENCOUNTER — Ambulatory Visit (INDEPENDENT_AMBULATORY_CARE_PROVIDER_SITE_OTHER): Payer: PPO | Admitting: Internal Medicine

## 2022-05-23 VITALS — BP 108/70 | HR 62 | Temp 97.6°F | Resp 16 | Ht 71.0 in | Wt 165.6 lb

## 2022-05-23 DIAGNOSIS — E88819 Insulin resistance, unspecified: Secondary | ICD-10-CM

## 2022-05-23 DIAGNOSIS — I700 Atherosclerosis of aorta: Secondary | ICD-10-CM

## 2022-05-23 DIAGNOSIS — Z79899 Other long term (current) drug therapy: Secondary | ICD-10-CM | POA: Diagnosis not present

## 2022-05-23 DIAGNOSIS — Z1211 Encounter for screening for malignant neoplasm of colon: Secondary | ICD-10-CM

## 2022-05-23 DIAGNOSIS — R7309 Other abnormal glucose: Secondary | ICD-10-CM | POA: Diagnosis not present

## 2022-05-23 DIAGNOSIS — Z8249 Family history of ischemic heart disease and other diseases of the circulatory system: Secondary | ICD-10-CM

## 2022-05-23 DIAGNOSIS — Z23 Encounter for immunization: Secondary | ICD-10-CM | POA: Diagnosis not present

## 2022-05-23 DIAGNOSIS — Z125 Encounter for screening for malignant neoplasm of prostate: Secondary | ICD-10-CM

## 2022-05-23 DIAGNOSIS — E782 Mixed hyperlipidemia: Secondary | ICD-10-CM | POA: Diagnosis not present

## 2022-05-23 DIAGNOSIS — N401 Enlarged prostate with lower urinary tract symptoms: Secondary | ICD-10-CM

## 2022-05-23 DIAGNOSIS — E559 Vitamin D deficiency, unspecified: Secondary | ICD-10-CM

## 2022-05-23 DIAGNOSIS — R030 Elevated blood-pressure reading, without diagnosis of hypertension: Secondary | ICD-10-CM

## 2022-05-23 DIAGNOSIS — Z0000 Encounter for general adult medical examination without abnormal findings: Secondary | ICD-10-CM

## 2022-05-23 DIAGNOSIS — Z136 Encounter for screening for cardiovascular disorders: Secondary | ICD-10-CM | POA: Diagnosis not present

## 2022-05-23 DIAGNOSIS — Z0001 Encounter for general adult medical examination with abnormal findings: Secondary | ICD-10-CM

## 2022-05-23 DIAGNOSIS — R0989 Other specified symptoms and signs involving the circulatory and respiratory systems: Secondary | ICD-10-CM

## 2022-05-23 DIAGNOSIS — Z87891 Personal history of nicotine dependence: Secondary | ICD-10-CM

## 2022-05-23 DIAGNOSIS — N138 Other obstructive and reflux uropathy: Secondary | ICD-10-CM | POA: Diagnosis not present

## 2022-05-24 LAB — URINALYSIS, ROUTINE W REFLEX MICROSCOPIC
Bilirubin Urine: NEGATIVE
Glucose, UA: NEGATIVE
Hgb urine dipstick: NEGATIVE
Ketones, ur: NEGATIVE
Leukocytes,Ua: NEGATIVE
Nitrite: NEGATIVE
Protein, ur: NEGATIVE
Specific Gravity, Urine: 1.02 (ref 1.001–1.035)
pH: 7 (ref 5.0–8.0)

## 2022-05-24 LAB — CBC WITH DIFFERENTIAL/PLATELET
Absolute Monocytes: 620 {cells}/uL (ref 200–950)
Basophils Absolute: 92 {cells}/uL (ref 0–200)
Basophils Relative: 1.4 %
Eosinophils Absolute: 198 {cells}/uL (ref 15–500)
Eosinophils Relative: 3 %
HCT: 47.6 % (ref 38.5–50.0)
Hemoglobin: 16.4 g/dL (ref 13.2–17.1)
Lymphs Abs: 1888 {cells}/uL (ref 850–3900)
MCH: 30.5 pg (ref 27.0–33.0)
MCHC: 34.5 g/dL (ref 32.0–36.0)
MCV: 88.6 fL (ref 80.0–100.0)
MPV: 9.4 fL (ref 7.5–12.5)
Monocytes Relative: 9.4 %
Neutro Abs: 3802 {cells}/uL (ref 1500–7800)
Neutrophils Relative %: 57.6 %
Platelets: 265 Thousand/uL (ref 140–400)
RBC: 5.37 Million/uL (ref 4.20–5.80)
RDW: 12.9 % (ref 11.0–15.0)
Total Lymphocyte: 28.6 %
WBC: 6.6 Thousand/uL (ref 3.8–10.8)

## 2022-05-24 LAB — COMPLETE METABOLIC PANEL WITHOUT GFR
AG Ratio: 2.1 (calc) (ref 1.0–2.5)
ALT: 9 U/L (ref 9–46)
AST: 14 U/L (ref 10–35)
Albumin: 4.4 g/dL (ref 3.6–5.1)
Alkaline phosphatase (APISO): 77 U/L (ref 35–144)
BUN: 12 mg/dL (ref 7–25)
CO2: 29 mmol/L (ref 20–32)
Calcium: 9.7 mg/dL (ref 8.6–10.3)
Chloride: 102 mmol/L (ref 98–110)
Creat: 0.99 mg/dL (ref 0.70–1.35)
Globulin: 2.1 g/dL (ref 1.9–3.7)
Glucose, Bld: 86 mg/dL (ref 65–99)
Potassium: 4.3 mmol/L (ref 3.5–5.3)
Sodium: 138 mmol/L (ref 135–146)
Total Bilirubin: 0.6 mg/dL (ref 0.2–1.2)
Total Protein: 6.5 g/dL (ref 6.1–8.1)
eGFR: 82 mL/min/1.73m2

## 2022-05-24 LAB — MICROALBUMIN / CREATININE URINE RATIO
Creatinine, Urine: 145 mg/dL (ref 20–320)
Microalb Creat Ratio: 3 mcg/mg creat (ref <30)
Microalb, Ur: 0.5 mg/dL

## 2022-05-24 LAB — LIPID PANEL
Cholesterol: 148 mg/dL
HDL: 31 mg/dL — ABNORMAL LOW
LDL Cholesterol (Calc): 94 mg/dL
Non-HDL Cholesterol (Calc): 117 mg/dL
Total CHOL/HDL Ratio: 4.8 (calc)
Triglycerides: 126 mg/dL

## 2022-05-24 LAB — HEMOGLOBIN A1C
Hgb A1c MFr Bld: 5.3 % of total Hgb (ref <5.7)
Mean Plasma Glucose: 105 mg/dL
eAG (mmol/L): 5.8 mmol/L

## 2022-05-24 LAB — INSULIN, RANDOM: Insulin: 14.7 u[IU]/mL

## 2022-05-24 LAB — VITAMIN D 25 HYDROXY (VIT D DEFICIENCY, FRACTURES): Vit D, 25-Hydroxy: 61 ng/mL (ref 30–100)

## 2022-05-24 LAB — MAGNESIUM: Magnesium: 2.4 mg/dL (ref 1.5–2.5)

## 2022-05-24 LAB — PSA: PSA: 0.67 ng/mL

## 2022-05-24 LAB — TSH: TSH: 0.88 mIU/L (ref 0.40–4.50)

## 2022-05-24 NOTE — Progress Notes (Signed)
<><><><><><><><><><><><><><><><><><><><><><><><><><><><><><><><><> <><><><><><><><><><><><><><><><><><><><><><><><><><><><><><><><><> -   Test results slightly outside the reference range are not unusual. If there is anything important, I will review this with you,  otherwise it is considered normal test values.  If you have further questions,  please do not hesitate to contact me at the office or via My Chart.  <><><><><><><><><><><><><><><><><><><><><><><><><><><><><><><><><> <><><><><><><><><><><><><><><><><><><><><><><><><><><><><><><><><>  -  Total Chol = 148  - Excellent  <><><><><><><><><><><><><><><><><><><><><><><><><><><><><><><><><>  -  PSA - Very low - No Prostate Cancer  <><><><><><><><><><><><><><><><><><><><><><><><><><><><><><><><><>  -  A1c - Normal - No Diabetes  - Great !  <><><><><><><><><><><><><><><><><><><><><><><><><><><><><><><><><>  -  Vitamin D = 61  -  Excellent - Please keep dose same  <><><><><><><><><><><><><><><><><><><><><><><><><><><><><><><><><>  -  All Else - CBC - Kidneys - Electrolytes - Liver - Magnesium & Thyroid    - all  Normal / OK <><><><><><><><><><><><><><><><><><><><><><><><><><><><><><><><><>  -  Keep up the Great Work !  <><><><><><><><><><><><><><><><><><><><><><><><><><><><><><><><><> <><><><><><><><><><><><><><><><><><><><><><><><><><><><><><><><><>

## 2022-05-31 LAB — COLOGUARD

## 2022-08-25 ENCOUNTER — Ambulatory Visit (INDEPENDENT_AMBULATORY_CARE_PROVIDER_SITE_OTHER): Payer: PPO | Admitting: Nurse Practitioner

## 2022-08-25 ENCOUNTER — Encounter: Payer: Self-pay | Admitting: Nurse Practitioner

## 2022-08-25 VITALS — BP 118/78 | HR 55 | Temp 97.5°F | Ht 71.0 in | Wt 175.4 lb

## 2022-08-25 DIAGNOSIS — E782 Mixed hyperlipidemia: Secondary | ICD-10-CM

## 2022-08-25 DIAGNOSIS — N200 Calculus of kidney: Secondary | ICD-10-CM | POA: Diagnosis not present

## 2022-08-25 DIAGNOSIS — Z0001 Encounter for general adult medical examination with abnormal findings: Secondary | ICD-10-CM | POA: Diagnosis not present

## 2022-08-25 DIAGNOSIS — N138 Other obstructive and reflux uropathy: Secondary | ICD-10-CM

## 2022-08-25 DIAGNOSIS — E291 Testicular hypofunction: Secondary | ICD-10-CM

## 2022-08-25 DIAGNOSIS — R6889 Other general symptoms and signs: Secondary | ICD-10-CM

## 2022-08-25 DIAGNOSIS — D649 Anemia, unspecified: Secondary | ICD-10-CM

## 2022-08-25 DIAGNOSIS — N401 Enlarged prostate with lower urinary tract symptoms: Secondary | ICD-10-CM | POA: Diagnosis not present

## 2022-08-25 DIAGNOSIS — E559 Vitamin D deficiency, unspecified: Secondary | ICD-10-CM

## 2022-08-25 DIAGNOSIS — F17290 Nicotine dependence, other tobacco product, uncomplicated: Secondary | ICD-10-CM

## 2022-08-25 DIAGNOSIS — Z0000 Encounter for general adult medical examination without abnormal findings: Secondary | ICD-10-CM

## 2022-08-25 DIAGNOSIS — R7309 Other abnormal glucose: Secondary | ICD-10-CM

## 2022-08-25 DIAGNOSIS — R0989 Other specified symptoms and signs involving the circulatory and respiratory systems: Secondary | ICD-10-CM

## 2022-08-25 DIAGNOSIS — Z6824 Body mass index (BMI) 24.0-24.9, adult: Secondary | ICD-10-CM

## 2022-08-25 DIAGNOSIS — Z79899 Other long term (current) drug therapy: Secondary | ICD-10-CM | POA: Diagnosis not present

## 2022-08-25 NOTE — Patient Instructions (Signed)
GENERAL HEALTH GOALS   Know what a healthy weight is for you (roughly BMI <25) and aim to maintain this   Aim for 7+ servings of fruits and vegetables daily   70-80+ fluid ounces of water or unsweet tea for healthy kidneys   Limit to max 1 drink of alcohol per day; avoid smoking/tobacco   Limit animal fats in diet for cholesterol and heart health - choose grass fed whenever available   Avoid highly processed foods, and foods high in saturated/trans fats   Aim for low stress - take time to unwind and care for your mental health   Aim for 150 min of moderate intensity exercise weekly for heart health, and weights twice weekly for bone health   Aim for 7-9 hours of sleep daily   Strongly encourage to stop vaping  Schedule eye doctor and dentist appointment

## 2022-08-25 NOTE — Progress Notes (Signed)
MEDICARE ANNUAL WELLNESS VISIT AND 3 MONTH FOLLOW UP  Assessment:   Diagnoses and all orders for this visit:  Medicare annual wellness visit, subsequent Yearly Strongly encouraged to schedule eye doctor and dentist appointment   Elevated blood pressure reading without diagnosis of hypertension Discussed dietary and exercise modifications, currently controlled without medications Discussed DASH diet.   Reminder to go to the ER if any CP, SOB, nausea, dizziness, severe HA, changes vision/speech, left arm numbness and tingling and jaw pain. - CBC  Hyperlipidemia Controlled with diet and exercise - Lipid panel - CMP  Hypogonadism male Asymptomatic  Nephrolithiasis History of Asymptomatic Continue to monitor Increase fluid intake  BPH with LUTS Doing well at this time  No medications Continue to monitor  Anemia, unspecified type Asymptomatic Will check labs next office visit  Vitamin D deficiency Currently not on supplementation Discussed supplementation Will check labs next office visit  BMI 24.0-24.9 Discussed dietary and exercise modifications  Medication Management Magnesium  Vaping nicotine product Strongly encouraged to cut back and quit Currently not ready to quit.    I provided 30 minutes of  face-to-face time during this encounter including counseling, chart review, and critical decision making was preformed.   Future Appointments  Date Time Provider Erick  11/30/2022  2:30 PM Unk Pinto, MD [facility] None  05/30/2023  2:00 PM Unk Pinto, MD [facility] None      Plan:   During the course of the visit the patient was educated and counseled about appropriate screening and preventive services including:   Pneumococcal vaccine  Influenza vaccine Prevnar 13 Td vaccine Colorectal cancer screening Diabetes screening Glaucoma screening Nutrition counseling    Subjective:  Adam Hammond is a 72 y.o. male  who presents for Medicare Annual Wellness Visit.  He has history of elevated blood pressure without diagnosis of HTN, nephrolithiasis, hypogonadism, anemia and low back pain.  Today he reports that he is doing well overall.  He does not have any health or medication concerns today.  BMI is Body mass index is 24.46 kg/m., he has been working on diet and exercise. Wt Readings from Last 3 Encounters:  08/25/22 175 lb 6.4 oz (79.6 kg)  05/23/22 165 lb 9.6 oz (75.1 kg)  08/24/21 178 lb 12.8 oz (81.1 kg)    BP is well controlled without medication BP Readings from Last 3 Encounters:  08/25/22 118/78  05/23/22 108/70  08/24/21 126/72  He does workout by means of walking his dogs and yard work. He does exercise 7 days a week by walking his dog  He does this for 45 min every day.  Denies chest pain, shortness of breath, dizziness.    He is not on cholesterol medication and denies myalgias. His cholesterol is  at goal.The cholesterol last visit was:   Lab Results  Component Value Date   CHOL 148 05/23/2022   HDL 31 (L) 05/23/2022   LDLCALC 94 05/23/2022   TRIG 126 05/23/2022   CHOLHDL 4.8 05/23/2022   He has not been working on diet and exercise for prediabetes, and denies hyperglycemia, hypoglycemia , increased appetite, nausea, paresthesia of the feet, polydipsia, polyuria, visual disturbances, vomiting and weight loss. Last A1C in the office was:  Lab Results  Component Value Date   HGBA1C 5.3 05/23/2022    Lab Results  Component Value Date   EGFR 82 05/23/2022      Patient is on Vitamin D supplement 5000iu daily  Last Vit D was Lab Results  Component Value Date   VD25OH 17 05/23/2022     He is vaping nicotine product many times throughout the day. Used to smoke and  quit  several years ago.   Medication Review:     Current Outpatient Medications (Analgesics):    aspirin EC 81 MG tablet, Take 81 mg by mouth daily. Swallow whole.   Current Outpatient Medications  (Other):    VITAMIN D PO, Take by mouth. Takes occasionally  Allergies: No Known Allergies  Current Problems (verified) has Nephrolithiasis; BCC (basal cell carcinoma), ear; Elevated BP without diagnosis of hypertension; Hypogonadism male; Labile hypertension; Abnormal glucose; Vitamin D deficiency; Screening for colorectal cancer; and BPH with obstruction/lower urinary tract symptoms on their problem list.  Screening Tests Immunization History  Administered Date(s) Administered   DTaP 05/20/2006   Influenza, High Dose Seasonal PF 05/20/2021   Influenza,inj,Quad PF,6+ Mos 05/23/2022   PFIZER(Purple Top)SARS-COV-2 Vaccination 08/15/2019, 09/10/2019   PPD Test 07/17/2013   Pfizer Covid-19 Vaccine Bivalent Booster 22yr & up 06/19/2020   Td 05/20/2006   Tdap 08/19/2014    Health Maintenance  Topic Date Due   Pneumonia Vaccine 70 Years old (1 of 1 - PCV) 08/25/2022 (Originally 08/13/2017)   COLONOSCOPY (Pts 45-467yrInsurance coverage will need to be confirmed)  08/25/2022 (Originally 08/13/1997)   COVID-19 Vaccine (4 - 2023-24 season) 09/10/2022 (Originally 02/18/2022)   Zoster Vaccines- Shingrix (1 of 2) 11/25/2022 (Originally 08/14/1969)   Medicare Annual Wellness (AWV)  08/25/2023   DTaP/Tdap/Td (4 - Td or Tdap) 08/18/2024   INFLUENZA VACCINE  Completed   Hepatitis C Screening  Completed   HPV VACCINES  Aged Out  Cologuard 12/23 sample could not be processed- needs to repeat, does not want to do another cologuard     Names of Other Physician/Practitioners you currently use: 1. [facility] here for primary care 2. Eye Exam, 2019 Plans to schedule appt with DR. ShGershon Crane. Dental Exam:2019  DUE for 2023  CXR 2014 DERM: 2016, WNL  Patient Care Team: McUnk PintoMD as PCP - General (Internal Medicine)  Surgical: He  has a past surgical history that includes Shoulder surgery (Left, 1997); Wrist surgery (Left, 1997); and Mohs surgery  (Left, 2014). Family His family history includes Alcohol abuse in his father; Cancer in his father, mother, and another family member; Diabetes in his sister; Heart attack in his father and sister; Heart disease in his father, sister, and sister; Hyperlipidemia in his father, mother, sister, and sister; Hypertension in his mother. Social history  He reports that he quit smoking about 10 years ago. His smoking use included cigarettes. He has never used smokeless tobacco. He reports that he does not use drugs. No history on file for alcohol use.  MEDICARE WELLNESS OBJECTIVES: Physical activity: Current Exercise Habits: Home exercise routine, Type of exercise: walking, Time (Minutes): 15, Frequency (Times/Week): 7, Weekly Exercise (Minutes/Week): 105, Intensity: Mild, Exercise limited by: None identified Cardiac risk factors: Cardiac Risk Factors include: advanced age (>55103m >65>34men);dyslipidemia;male gender;smoking/ tobacco exposure Depression/mood screen:      08/25/2022    3:16 PM  Depression screen PHQ 2/9  Decreased Interest 0  Down, Depressed, Hopeless 0  PHQ - 2 Score 0    ADLs:     08/25/2022    3:16 PM 05/22/2022   11:59 PM  In your present state of health, do you have any difficulty performing the following activities:  Hearing? 0 0  Vision? 0 0  Difficulty concentrating or  making decisions? 0 0  Walking or climbing stairs? 0 0  Dressing or bathing? 0 0  Doing errands, shopping? 0 0     Cognitive Testing  Alert? Yes  Normal Appearance?Yes  Oriented to person? Yes  Place? Yes   Time? Yes  Recall of three objects?  Yes  Can perform simple calculations? Yes  Displays appropriate judgment?Yes  Can read the correct time from a watch face?Yes  EOL planning: Does Patient Have a Medical Advance Directive?: No Would patient like information on creating a medical advance directive?: No - Patient declined   Objective:   Today's Vitals   08/25/22 1504  BP: 118/78  Pulse:  (!) 55  Temp: (!) 97.5 F (36.4 C)  SpO2: 95%  Weight: 175 lb 6.4 oz (79.6 kg)  Height: '5\' 11"'$  (1.803 m)     Body mass index is 24.46 kg/m.  General Appearance: Well nourished, in no apparent distress. Eyes: PERRLA, EOMs, conjunctiva no swelling or erythema Sinuses: No Frontal/maxillary tenderness ENT/Mouth: Ext aud canals clear, TMs without erythema, bulging. No erythema, swelling, or exudate on post pharynx.  Tonsils not swollen or erythematous. Hearing normal.  Neck: Supple, thyroid normal.  Respiratory: Respiratory effort normal, BS equal bilaterally without rales, rhonchi, wheezing or stridor.  Cardio: RRR with no MRGs. Brisk peripheral pulses without edema.  Abdomen: Soft, + BS.  Non tender, no guarding, rebound, hernias, masses. Lymphatics: Non tender without lymphadenopathy.  Musculoskeletal: Full ROM, 5/5 strength, Normal gait Skin: Warm, dry without rashes, lesions, ecchymosis.  Neuro: Cranial nerves intact. No cerebellar symptoms.  Psych: Awake and oriented X 3, normal affect, Insight and Judgment appropriate.    Medicare Attestation I have personally reviewed: The patient's medical and social history Their use of alcohol, tobacco or illicit drugs Their current medications and supplements The patient's functional ability including ADLs,fall risks, home safety risks, cognitive, and hearing and visual impairment Diet and physical activities Evidence for depression or mood disorders  The patient's weight, height, BMI, and visual acuity have been recorded in the chart.  I have made referrals, counseling, and provided education to the patient based on review of the above and I have provided the patient with a written personalized care plan for preventive services.    Magda Bernheim ANP-C  [facility]  08/25/2022

## 2022-08-26 LAB — CBC WITH DIFFERENTIAL/PLATELET
Absolute Monocytes: 703 cells/uL (ref 200–950)
Basophils Absolute: 71 cells/uL (ref 0–200)
Basophils Relative: 1 %
Eosinophils Absolute: 241 cells/uL (ref 15–500)
Eosinophils Relative: 3.4 %
HCT: 49.2 % (ref 38.5–50.0)
Hemoglobin: 16.7 g/dL (ref 13.2–17.1)
Lymphs Abs: 2144 cells/uL (ref 850–3900)
MCH: 29.9 pg (ref 27.0–33.0)
MCHC: 33.9 g/dL (ref 32.0–36.0)
MCV: 88 fL (ref 80.0–100.0)
MPV: 9.4 fL (ref 7.5–12.5)
Monocytes Relative: 9.9 %
Neutro Abs: 3941 cells/uL (ref 1500–7800)
Neutrophils Relative %: 55.5 %
Platelets: 288 10*3/uL (ref 140–400)
RBC: 5.59 10*6/uL (ref 4.20–5.80)
RDW: 12.4 % (ref 11.0–15.0)
Total Lymphocyte: 30.2 %
WBC: 7.1 10*3/uL (ref 3.8–10.8)

## 2022-08-26 LAB — COMPLETE METABOLIC PANEL WITH GFR
AG Ratio: 1.9 (calc) (ref 1.0–2.5)
ALT: 10 U/L (ref 9–46)
AST: 11 U/L (ref 10–35)
Albumin: 4.7 g/dL (ref 3.6–5.1)
Alkaline phosphatase (APISO): 83 U/L (ref 35–144)
BUN: 11 mg/dL (ref 7–25)
CO2: 30 mmol/L (ref 20–32)
Calcium: 10.2 mg/dL (ref 8.6–10.3)
Chloride: 102 mmol/L (ref 98–110)
Creat: 1.06 mg/dL (ref 0.70–1.28)
Globulin: 2.5 g/dL (calc) (ref 1.9–3.7)
Glucose, Bld: 88 mg/dL (ref 65–99)
Potassium: 4.6 mmol/L (ref 3.5–5.3)
Sodium: 140 mmol/L (ref 135–146)
Total Bilirubin: 0.6 mg/dL (ref 0.2–1.2)
Total Protein: 7.2 g/dL (ref 6.1–8.1)
eGFR: 75 mL/min/{1.73_m2} (ref >=60)

## 2022-08-26 LAB — LIPID PANEL
Cholesterol: 165 mg/dL (ref <200)
HDL: 34 mg/dL — ABNORMAL LOW (ref >=40)
LDL Cholesterol (Calc): 101 mg/dL (calc) — ABNORMAL HIGH
Non-HDL Cholesterol (Calc): 131 mg/dL (calc) — ABNORMAL HIGH (ref <130)
Total CHOL/HDL Ratio: 4.9 (calc) (ref <5.0)
Triglycerides: 181 mg/dL — ABNORMAL HIGH (ref <150)

## 2022-08-26 LAB — MAGNESIUM: Magnesium: 2.5 mg/dL (ref 1.5–2.5)

## 2022-11-27 ENCOUNTER — Encounter: Payer: Self-pay | Admitting: Internal Medicine

## 2022-11-27 NOTE — Patient Instructions (Signed)

## 2022-11-27 NOTE — Progress Notes (Unsigned)
Future Appointments  Date Time Provider Department  11/28/2022                      6 mo ov  3:30 PM Lucky Cowboy, MD [facility]  05/30/2023                    cpe  2:00 PM Lucky Cowboy, MD [facility]  08/31/2023                      wellness  3:30 PM Raynelle Dick, NP [facility]    History of Present Illness:       This very nice 72 y.o. MWM  presents for 6  month follow up with HTN, HLD, Pre-Diabetes and Vitamin D Deficiency.         Patient is  followed expectantly  for HTN  since 2007  & BP has been controlled at home. Today's BP is at goal  -  130/78. Patient has had no complaints of any cardiac type chest pain, palpitations, dyspnea Pollyann Kennedy /PND, dizziness, claudication  or dependent edema.        Hyperlipidemia is controlled with diet. 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   Patient  has had no symptoms of reactive hypoglycemia, diabetic polys, paresthesias or visual blurring.  Last A1c was Normal & at goal :  Lab Results  Component Value Date   HGBA1C 5.3 05/23/2022                                                        Further, the patient also has history of Vitamin D Deficiency  ("28" /2015) and supplements vitamin D . Last vitamin D was at goal :  Lab Results  Component Value Date   VD25OH 61 05/23/2022     Current Outpatient Medications  Medication Instructions   aspirin EC Daily   VITAMIN D  Takes occasionally     No Known Allergies    PMHx:   Past Medical History:  Diagnosis Date   Actinic keratitis    Anemia    BCC (basal cell carcinoma), ear    left ear   Elevated blood pressure reading  without diagnosis of hypertension    Hypogonadism male    Nephrolithiasis 2006     Immunization History  Administered Date(s) Administered   DTaP 05/20/2006   Influenza, High Dose   05/20/2021   Influenza,inj, Has Dec CPE - Sonja Manseau  05/23/2022   PFIZER SARS-COV-2 Vacc  08/15/2019, 09/10/2019   PPD Test 07/17/2013   Pfizer Covid-19 Vaccine   06/19/2020   Td 05/20/2006   Tdap 08/19/2014     Past Surgical History:  Procedure Laterality Date   MOHS SURGERY Left 2014   ear [facility]   SHOULDER SURGERY Left 1997   WRIST SURGERY Left 1997     FHx:    Reviewed / unchanged  SHx:    Reviewed / unchanged    Systems Review:  Constitutional: Denies fever, chills, wt changes, headaches, insomnia, fatigue, night sweats, change in appetite. Eyes: Denies redness, blurred vision, diplopia, discharge, itchy, watery eyes.  ENT: Denies discharge, congestion, post nasal drip, epistaxis, sore throat, earache, hearing loss, dental pain, tinnitus, vertigo, sinus pain, snoring.  CV: Denies chest pain, palpitations, irregular heartbeat, syncope, dyspnea, diaphoresis, orthopnea, PND, claudication or edema. Respiratory: denies cough, dyspnea, DOE, pleurisy, hoarseness, laryngitis, wheezing.  Gastrointestinal: Denies dysphagia, odynophagia, heartburn, reflux, water brash, abdominal pain or cramps, nausea, vomiting, bloating, diarrhea, constipation, hematemesis, melena, hematochezia  or hemorrhoids. Genitourinary: Denies dysuria, frequency, urgency, nocturia, hesitancy, discharge, hematuria or flank pain. Musculoskeletal: Denies arthralgias, myalgias, stiffness, jt. swelling, pain, limping or strain/sprain.  Skin: Denies pruritus, rash, hives, warts, acne, eczema or change in skin lesion(s). Neuro: No weakness, tremor, incoordination, spasms, paresthesia or pain. Psychiatric: Denies confusion, memory loss or sensory loss. Endo: Denies change in weight, skin or hair change.  Heme/Lymph: No excessive  bleeding, bruising or enlarged lymph nodes.   Physical Exam  BP 130/78   Pulse 61   Temp 97.6 F (36.4 C)   Resp 16   Ht 5\' 11"  (1.803 m)   Wt 173 lb 3.2 oz (78.6 kg)   SpO2 98%   BMI 24.16 kg/m   Appears  well nourished, well groomed  and in no distress.  Eyes: PERRLA, EOMs, conjunctiva no swelling or erythema. Sinuses: No frontal/maxillary tenderness ENT/Mouth: EAC's clear, TM's nl w/o erythema, bulging. Nares clear w/o erythema, swelling, exudates. Oropharynx clear without erythema or exudates. Oral hygiene is good. Tongue normal, non obstructing. Hearing intact.  Neck: Supple. Thyroid not palpable. Car 2+/2+ without bruits, nodes or JVD. Chest: Respirations nl with BS clear & equal w/o rales, rhonchi, wheezing or stridor.  Cor: Heart sounds normal w/ regular rate and rhythm without sig. murmurs, gallops, clicks or rubs. Peripheral pulses normal and equal  without edema.  Abdomen: Soft & bowel sounds normal. Non-tender w/o guarding, rebound, hernias, masses or organomegaly.  Lymphatics: Unremarkable.  Musculoskeletal: Full ROM all peripheral extremities, joint stability, 5/5 strength and normal gait.  Skin: Warm, dry without exposed rashes, lesions or ecchymosis apparent.  Neuro: Cranial nerves intact, reflexes equal bilaterally. Sensory-motor testing grossly intact. Tendon reflexes grossly intact.  Pysch: Alert & oriented x 3.  Insight and judgement nl & appropriate. No ideations.   Assessment and Plan:   1. Labile hypertension  - Continue medication, monitor blood pressure at home.  - Continue DASH diet.  Reminder to go to the ER if any CP,  SOB, nausea, dizziness, severe HA, changes vision/speech.    - CBC with Differential/Platelet - COMPLETE METABOLIC PANEL WITH GFR - Magnesium - TSH   2. Hyperlipidemia, mixed  - Continue diet/meds, exercise,& lifestyle modifications.  - Continue monitor periodic cholesterol/liver & renal functions     - Lipid panel -  TSH   3. Abnormal glucose  - Continue diet, exercise  - Lifestyle modifications.  - Monitor appropriate labs    - Hemoglobin A1c - Insulin, random   4. Insulin resistance  - Continue diet, exercise  - Lifestyle modifications.  - Monitor appropriate labs  - Hemoglobin A1c - Insulin, random   5. Vitamin D deficiency    - Continue supplementation    - VITAMIN D 25 Hydroxy    6. Medication management - CBC with Differential/Platelet - COMPLETE METABOLIC PANEL WITH GFR - Magnesium - Lipid panel - TSH - Hemoglobin  A1c - Insulin, random - VITAMIN D 25 Hydroxy          Discussed  regular exercise, BP monitoring, weight control to achieve/maintain BMI less than 25 and discussed med and SE's. Recommended labs to assess /monitor clinical status .  I discussed the assessment and treatment plan with the patient. The patient was provided an opportunity to ask questions and all were answered. The patient agreed with the plan and demonstrated an understanding of the instructions.  I provided over 30 minutes of exam, counseling, chart review and  complex critical decision making.        The patient was advised to call back or seek an in-person evaluation if the symptoms worsen or if the condition fails to improve as anticipated.   Marinus Maw, MD

## 2022-11-28 ENCOUNTER — Encounter: Payer: Self-pay | Admitting: Internal Medicine

## 2022-11-28 ENCOUNTER — Ambulatory Visit (INDEPENDENT_AMBULATORY_CARE_PROVIDER_SITE_OTHER): Payer: PPO | Admitting: Internal Medicine

## 2022-11-28 VITALS — BP 130/78 | HR 61 | Temp 97.6°F | Resp 16 | Ht 71.0 in | Wt 173.2 lb

## 2022-11-28 DIAGNOSIS — R0989 Other specified symptoms and signs involving the circulatory and respiratory systems: Secondary | ICD-10-CM

## 2022-11-28 DIAGNOSIS — E559 Vitamin D deficiency, unspecified: Secondary | ICD-10-CM

## 2022-11-28 DIAGNOSIS — Z79899 Other long term (current) drug therapy: Secondary | ICD-10-CM | POA: Diagnosis not present

## 2022-11-28 DIAGNOSIS — E782 Mixed hyperlipidemia: Secondary | ICD-10-CM

## 2022-11-28 DIAGNOSIS — E88819 Insulin resistance, unspecified: Secondary | ICD-10-CM | POA: Diagnosis not present

## 2022-11-28 DIAGNOSIS — R7309 Other abnormal glucose: Secondary | ICD-10-CM

## 2022-11-29 LAB — CBC WITH DIFFERENTIAL/PLATELET
Absolute Monocytes: 619 cells/uL (ref 200–950)
Basophils Absolute: 77 cells/uL (ref 0–200)
Basophils Relative: 0.9 %
Eosinophils Absolute: 292 cells/uL (ref 15–500)
Eosinophils Relative: 3.4 %
HCT: 47.7 % (ref 38.5–50.0)
Hemoglobin: 16.8 g/dL (ref 13.2–17.1)
Lymphs Abs: 2683 cells/uL (ref 850–3900)
MCH: 30.5 pg (ref 27.0–33.0)
MCHC: 35.2 g/dL (ref 32.0–36.0)
MCV: 86.6 fL (ref 80.0–100.0)
MPV: 9.6 fL (ref 7.5–12.5)
Monocytes Relative: 7.2 %
Neutro Abs: 4928 cells/uL (ref 1500–7800)
Neutrophils Relative %: 57.3 %
Platelets: 290 10*3/uL (ref 140–400)
RBC: 5.51 10*6/uL (ref 4.20–5.80)
RDW: 12.1 % (ref 11.0–15.0)
Total Lymphocyte: 31.2 %
WBC: 8.6 10*3/uL (ref 3.8–10.8)

## 2022-11-29 LAB — COMPLETE METABOLIC PANEL WITH GFR
AG Ratio: 2 (calc) (ref 1.0–2.5)
ALT: 10 U/L (ref 9–46)
AST: 13 U/L (ref 10–35)
Albumin: 4.6 g/dL (ref 3.6–5.1)
Alkaline phosphatase (APISO): 86 U/L (ref 35–144)
BUN: 10 mg/dL (ref 7–25)
CO2: 27 mmol/L (ref 20–32)
Calcium: 10 mg/dL (ref 8.6–10.3)
Chloride: 105 mmol/L (ref 98–110)
Creat: 1.02 mg/dL (ref 0.70–1.28)
Globulin: 2.3 g/dL (calc) (ref 1.9–3.7)
Glucose, Bld: 101 mg/dL — ABNORMAL HIGH (ref 65–99)
Potassium: 4.3 mmol/L (ref 3.5–5.3)
Sodium: 139 mmol/L (ref 135–146)
Total Bilirubin: 0.7 mg/dL (ref 0.2–1.2)
Total Protein: 6.9 g/dL (ref 6.1–8.1)
eGFR: 79 mL/min/{1.73_m2} (ref >=60)

## 2022-11-29 LAB — HEMOGLOBIN A1C
Hgb A1c MFr Bld: 5.2 % of total Hgb (ref <5.7)
Mean Plasma Glucose: 103 mg/dL
eAG (mmol/L): 5.7 mmol/L

## 2022-11-29 LAB — VITAMIN D 25 HYDROXY (VIT D DEFICIENCY, FRACTURES): Vit D, 25-Hydroxy: 71 ng/mL (ref 30–100)

## 2022-11-29 LAB — TSH: TSH: 1.04 mIU/L (ref 0.40–4.50)

## 2022-11-29 LAB — LIPID PANEL
Cholesterol: 165 mg/dL (ref <200)
HDL: 33 mg/dL — ABNORMAL LOW (ref >=40)
LDL Cholesterol (Calc): 100 mg/dL (calc) — ABNORMAL HIGH
Non-HDL Cholesterol (Calc): 132 mg/dL (calc) — ABNORMAL HIGH (ref <130)
Total CHOL/HDL Ratio: 5 (calc) — ABNORMAL HIGH (ref <5.0)
Triglycerides: 206 mg/dL — ABNORMAL HIGH (ref <150)

## 2022-11-29 LAB — INSULIN, RANDOM: Insulin: 35.8 u[IU]/mL — ABNORMAL HIGH

## 2022-11-29 LAB — MAGNESIUM: Magnesium: 2.3 mg/dL (ref 1.5–2.5)

## 2022-11-29 NOTE — Progress Notes (Signed)
^<^<^<^<^<^<^<^<^<^<^<^<^<^<^<^<^<^<^<^<^<^<^<^<^<^<^<^<^<^<^<^<^<^<^<^<^ ^>^>^>^>^>^>^>^>^>^>^>>^>^>^>^>^>^>^>^>^>^>^>^>^>^>^>^>^>^>^>^>^>^>^>^>^>  -  Test results slightly outside the reference range are not unusual. If there is anything important, I will review this with you,  otherwise it is considered normal test values.  If you have further questions,  please do not hesitate to contact me at the office or via My Chart.   ^<^<^<^<^<^<^<^<^<^<^<^<^<^<^<^<^<^<^<^<^<^<^<^<^<^<^<^<^<^<^<^<^<^<^<^<^ ^>^>^>^>^>^>^>^>^>^>^>^>^>^>^>^>^>^>^>^>^>^>^>^>^>^>^>^>^>^>^>^>^>^>^>^>^  -  Chol   = 165 -   Excellent   - Very low risk for Heart Attack  / Stroke  ^>^>^>^>^>^>^>^>^>^>^>^>^>^>^>^>^>^>^>^>^>^>^>^>^>^>^>^>^>^>^>^>^>^>^>^>^ ^>^>^>^>^>^>^>^>^>^>^>^>^>^>^>^>^>^>^>^>^>^>^>^>^>^>^>^>^>^>^>^>^>^>^>^>^  -  Triglycerides ( = 206  ) or fats in blood are too high                 (   Ideal or  Goal is less than 150  !  )    - Recommend avoid fried & greasy foods,  sweets / candy,   - Avoid white rice  (brown or wild rice or Quinoa is OK),   - Avoid white potatoes  (sweet potatoes are OK)   - Avoid anything made from white flour  - bagels, doughnuts, rolls, buns, biscuits, white and   wheat breads, pizza crust and traditional  pasta made of white flour & egg white  - (vegetarian pasta or spinach or wheat pasta is OK).    - Multi-grain bread is OK - like multi-grain flat bread or  sandwich thins.   - Avoid alcohol in excess.   - Exercise is also important.  ^<^<^<^<^<^<^<^<^<^<^<^<^<^<^<^<^<^<^<^<^<^<^<^<^<^<^<^<^<^<^<^<^<^<^<^<^ ^>^>^>^>^>^>^>^>^>^>^>^>^>^>^>^>^>^>^>^>^>^>^>^>^>^>^>^>^>^>^>^>^>^>^>^>^  - A1c - Normal  - No Diabetes  - Great !   ^<^<^<^<^<^<^<^<^<^<^<^<^<^<^<^<^<^<^<^<^<^<^<^<^<^<^<^<^<^<^<^<^<^<^<^<^ ^>^>^>^>^>^>^>^>^>^>^>^>^>^>^>^>^>^>^>^>^>^>^>^>^>^>^>^>^>^>^>^>^>^>^>^>^  - Vitamin D = 71 - Excellent - Please   keep dosage same    ^<^<^<^<^<^<^<^<^<^<^<^<^<^<^<^<^<^<^<^<^<^<^<^<^<^<^<^<^<^<^<^<^<^<^<^<^ ^>^>^>^>^>^>^>^>^>^>^>^>^>^>^>^>^>^>^>^>^>^>^>^>^>^>^>^>^>^>^>^>^>^>^>^>^  - All Else - CBC - Kidneys - Electrolytes - Liver - Magnesium & Thyroid    - all  Normal / OK  ^<^<^<^<^<^<^<^<^<^<^<^<^<^<^<^<^<^<^<^<^<^<^<^<^<^<^<^<^<^<^<^<^<^<^<^<^ ^>^>^>^>^>^>^>^>^>^>^>^>^>^>^>^>^>^>^>^>^>^>^>^>^>^>^>^>^>^>^>^>^>^>^>^>^  -  Keep up the [place] Work  !   ^<^<^<^<^<^<^<^<^<^<^<^<^<^<^<^<^<^<^<^<^<^<^<^<^<^<^<^<^<^<^<^<^<^<^<^<^ ^>^>^>^>^>^>^>^>^>^>^>^>^>^>^>^>^>^>^>^>^>^>^>^>^>^>^>^>^>^>^>^>^>^>^>^>^

## 2022-11-30 ENCOUNTER — Ambulatory Visit: Payer: PPO | Admitting: Internal Medicine

## 2023-05-30 ENCOUNTER — Ambulatory Visit (INDEPENDENT_AMBULATORY_CARE_PROVIDER_SITE_OTHER): Payer: PPO | Admitting: Internal Medicine

## 2023-05-30 ENCOUNTER — Encounter: Payer: Self-pay | Admitting: Internal Medicine

## 2023-05-30 VITALS — BP 136/80 | HR 60 | Temp 97.9°F | Resp 17 | Ht 71.0 in | Wt 176.2 lb

## 2023-05-30 DIAGNOSIS — E88819 Insulin resistance, unspecified: Secondary | ICD-10-CM | POA: Diagnosis not present

## 2023-05-30 DIAGNOSIS — N138 Other obstructive and reflux uropathy: Secondary | ICD-10-CM | POA: Diagnosis not present

## 2023-05-30 DIAGNOSIS — Z79899 Other long term (current) drug therapy: Secondary | ICD-10-CM

## 2023-05-30 DIAGNOSIS — Z0000 Encounter for general adult medical examination without abnormal findings: Secondary | ICD-10-CM

## 2023-05-30 DIAGNOSIS — R7309 Other abnormal glucose: Secondary | ICD-10-CM | POA: Diagnosis not present

## 2023-05-30 DIAGNOSIS — E559 Vitamin D deficiency, unspecified: Secondary | ICD-10-CM

## 2023-05-30 DIAGNOSIS — R0989 Other specified symptoms and signs involving the circulatory and respiratory systems: Secondary | ICD-10-CM | POA: Diagnosis not present

## 2023-05-30 DIAGNOSIS — N401 Enlarged prostate with lower urinary tract symptoms: Secondary | ICD-10-CM | POA: Diagnosis not present

## 2023-05-30 DIAGNOSIS — Z8249 Family history of ischemic heart disease and other diseases of the circulatory system: Secondary | ICD-10-CM

## 2023-05-30 DIAGNOSIS — E782 Mixed hyperlipidemia: Secondary | ICD-10-CM

## 2023-05-30 DIAGNOSIS — Z125 Encounter for screening for malignant neoplasm of prostate: Secondary | ICD-10-CM

## 2023-05-30 DIAGNOSIS — Z136 Encounter for screening for cardiovascular disorders: Secondary | ICD-10-CM

## 2023-05-30 DIAGNOSIS — Z87891 Personal history of nicotine dependence: Secondary | ICD-10-CM

## 2023-05-30 DIAGNOSIS — Z1212 Encounter for screening for malignant neoplasm of rectum: Secondary | ICD-10-CM

## 2023-05-30 DIAGNOSIS — Z0001 Encounter for general adult medical examination with abnormal findings: Secondary | ICD-10-CM

## 2023-05-30 NOTE — Patient Instructions (Signed)

## 2023-05-30 NOTE — Progress Notes (Signed)
[place]      ADULT   &   ADOLESCENT      INTERNAL MEDICINE  Lucky Cowboy, M.D.          Rance Muir, ANP        Adela Glimpse, FNP  [facility] [address]  [place], [place]. [address] Telephone [phone] Telefax [phone]   Annual  Screening/Preventative Visit  & Comprehensive Evaluation & Examination   Future Appointments  Date Time Provider Department  05/23/2022                           cpe  2:00 PM Lucky Cowboy, MD [facility]  08/25/2022                            wellness  3:00 PM Raynelle Dick, NP [facility]  05/30/2023                          cpe  2:00 PM Lucky Cowboy, MD [facility]            This very nice 72 y.o. MWM with   HTN, HLD, Prediabetes and Vitamin D Deficiency  presents for a Screening /Preventative Visit & comprehensive evaluation and management of multiple medical co-morbidities.          Labile HTN predates since  2007. Patient's BP has been controlled and today's BP is at goal -  136/80. Patient denies any cardiac symptoms as chest pain, palpitations, shortness of breath, dizziness or ankle swelling.        Patient's hyperlipidemia is  not controlled with diet. Last lipids were not  at goal :  Lab Results  Component Value Date   CHOL 165 11/28/2022   HDL 33 (L) 11/28/2022   LDLCALC 100 (H) 11/28/2022   TRIG 206 (H) 11/28/2022   CHOLHDL 5.0 (H) 11/28/2022         Patient is monitored expectantly for glucose intolerance & prediabetes. 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 Last A1c was normal :   Lab Results  Component Value Date   HGBA1C 5.3 05/05/2020    Insulin = 122.8    In Dec 2022  ( Nl < 20 )   Lab Results  Component Value Date   HGBA1C 5.2 11/28/2022   Wt Readings from Last 3 Encounters:  05/30/23 176 lb 3.2 oz (79.9 kg)  11/28/22 173 lb 3.2 oz (78.6  kg)  08/25/22 175 lb 6.4 oz (79.6 kg)         Finally, patient has history of Vitamin D Deficiency ("28" /2015) and last vitamin D was borderline elevated :   Lab Results  Component Value Date   VD25OH 71 11/28/2022       Current Outpatient Medications  Medication Instructions   aspirin EC   81 mg Daily   VITAMIN D    Takes occasionally      No Known Allergies       Past Medical History:  Diagnosis Date   Actinic keratitis    Anemia    BCC (basal cell carcinoma), ear    left ear   Elevated blood pressure reading without diagnosis of hypertension    Hypogonadism male    Nephrolithiasis 2006     Health Maintenance  Topic  Date Due   Pneumonia Vaccine 64+ Years old (1 - PCV) Never done   Zoster Vaccines- Shingrix (1 of 2) Never done   COLONOSCOPY  Never done   COVID-19 Vaccine (3 - Pfizer risk series) 10/08/2019   INFLUENZA VACCINE  Never done   TETANUS/TDAP  08/18/2024   Hepatitis C Screening  Completed   HPV VACCINES  Aged Out     Immunization History  Administered Date(s) Administered   DTaP 05/20/2006   PFIZER SARS-COV-2 Vacc 08/15/2019, 09/10/2019   PPD Test 07/17/2013   Td 05/20/2006   Tdap 08/19/2014    Last Colon - Never   Past Surgical History:  Procedure Laterality Date   MOHS SURGERY Left 2014   ear BCC   SHOULDER SURGERY Left 1997   WRIST SURGERY Left 1997     Family History  Problem Relation Age of Onset   Hypertension Mother    Cancer Mother        metastatic melanoma   Hyperlipidemia Mother    Heart attack Father    Alcohol abuse Father    Heart disease Father    Hyperlipidemia Father    Cancer Father        throat   Diabetes Sister        juvenile   Heart attack Sister    Heart disease Sister    Hyperlipidemia Sister    Cancer Other        lymphoma   Heart disease Sister    Hyperlipidemia Sister      Social History   Tobacco Use   Smoking status: Former    Types: Cigarettes    Quit date: 05/14/2012    Years  since quitting: 9.0   Smokeless tobacco: Never   Tobacco comments:    uses E cig daily  Substance Use Topics   Drug use: No      ROS Constitutional: Denies fever, chills, weight loss/gain, headaches, insomnia,  night sweats or change in appetite. Does c/o fatigue. Eyes: Denies redness, blurred vision, diplopia, discharge, itchy or watery eyes.  ENT: Denies discharge, congestion, post nasal drip, epistaxis, sore throat, earache, hearing loss, dental pain, Tinnitus, Vertigo, Sinus pain or snoring.  Cardio: Denies chest pain, palpitations, irregular heartbeat, syncope, dyspnea, diaphoresis, orthopnea, PND, claudication or edema Respiratory: denies cough, dyspnea, DOE, pleurisy, hoarseness, laryngitis or wheezing.  Gastrointestinal: Denies dysphagia, heartburn, reflux, water brash, pain, cramps, nausea, vomiting, bloating, diarrhea, constipation, hematemesis, melena, hematochezia, jaundice or hemorrhoids Genitourinary: Denies dysuria, frequency,  discharge, hematuria or flank pain. Has urgency, nocturia x 2-3 & occasional hesitancy. Musculoskeletal: Denies arthralgia, myalgia, stiffness, Jt. Swelling, pain, limp or strain/sprain. Denies Falls. Skin: Denies puritis, rash, hives, warts, acne, eczema or change in skin lesion Neuro: No weakness, tremor, incoordination, spasms, paresthesia or pain Psychiatric: Denies confusion, memory loss or sensory loss. Denies Depression. Endocrine: Denies change in weight, skin, hair change, nocturia, and paresthesia, diabetic polys, visual blurring or hyper / hypo glycemic episodes.  Heme/Lymph: No excessive bleeding, bruising or enlarged lymph nodes.   Physical Exam  BP 136/80   Pulse 60   Temp 97.9 F (36.6 C)   Resp 17   Ht 5\' 11"  (1.803 m)   Wt 176 lb 3.2 oz (79.9 kg)   SpO2 99%   BMI 24.57 kg/m   General Appearance: Well nourished and well groomed and in no apparent distress.  Eyes: PERRLA, EOMs, conjunctiva no swelling or erythema, normal  fundi and vessels. Sinuses: No frontal/maxillary tenderness ENT/Mouth: EACs patent /  TMs  nl. Nares clear without erythema, swelling, mucoid exudates. Oral hygiene is good. No erythema, swelling, or exudate. Tongue normal, non-obstructing. Tonsils not swollen or erythematous. Hearing normal.  Neck: Supple, thyroid not palpable. No bruits, nodes or JVD. Respiratory: Respiratory effort normal.  BS equal and clear bilateral without rales, rhonci, wheezing or stridor. Cardio: Heart sounds are normal with regular rate and rhythm and no murmurs, rubs or gallops. Peripheral pulses are normal and equal bilaterally without edema. No aortic or femoral bruits. Chest: symmetric with normal excursions and percussion.  Abdomen: Soft, with Nl bowel sounds. Nontender, no guarding, rebound, hernias, masses, or organomegaly.  Lymphatics: Non tender without lymphadenopathy.  Musculoskeletal: Full ROM all peripheral extremities, joint stability, 5/5 strength, and normal gait. Skin: Warm and dry without rashes, lesions, cyanosis, clubbing or  ecchymosis.  Neuro: Cranial nerves intact, reflexes equal bilaterally. Normal muscle tone, no cerebellar symptoms. Sensation intact.  Pysch: Alert and oriented x 3 with normal affect, insight and judgment appropriate.   Assessment and Plan  1. Annual Preventative/Screening Exam    2. Labile Hypertension  - EKG 12-Lead - [place], RETROPERITNL ABD,  LTD - Urinalysis, Routine w reflex microscopic - Microalbumin / creatinine urine ratio - CBC with Differential/Platelet - COMPLETE METABOLIC PANEL WITH GFR - Magnesium - TSH  3. Hyperlipidemia, mixed  - EKG 12-Lead - [place], RETROPERITNL ABD,  LTD - Lipid panel - TSH  4. Abnormal glucose  - EKG 12-Lead - [place], RETROPERITNL ABD,  LTD - Hemoglobin A1c - Insulin, random  5. Vitamin D deficiency  - VITAMIN D 25 Hydroxy  6. Insulin resistance  - EKG 12-Lead - [place], RETROPERITNL ABD,  LTD - Hemoglobin A1c - Insulin,  random  7. BPH with obstruction/lower urinary tract symptoms  - PSA  8. Screening for colorectal cancer  - Cologuard  9. Prostate cancer screening  - PSA  10. Screening for heart disease  - EKG 12-Lead  11. FHx: heart disease  - EKG 12-Lead - [place], RETROPERITNL ABD,  LTD  12. Former smoker  - EKG 12-Lead - [place], RETROPERITNL ABD,  LTD  13. Screening for AAA (aortic abdominal aneurysm)  - [place], RETROPERITNL ABD,  LTD  14. Medication management  - Urinalysis, Routine w reflex microscopic - Microalbumin / creatinine urine ratio - CBC with Differential/Platelet - COMPLETE METABOLIC PANEL WITH GFR - Magnesium - Lipid panel - TSH - Hemoglobin A1c - Insulin, random - VITAMIN D 25 Hydroxy            Patient was counseled in prudent diet, weight control to achieve/maintain BMI less than 25, BP monitoring, regular exercise and medications as discussed.  Discussed med effects and SE's. Routine screening labs and tests as requested with regular follow-up as recommended. Over 40 minutes of exam, counseling, chart review and high complex critical decision making was performed   Marinus Maw, MD

## 2023-05-31 LAB — CBC WITH DIFFERENTIAL/PLATELET
Absolute Lymphocytes: 2248 {cells}/uL (ref 850–3900)
Absolute Monocytes: 650 {cells}/uL (ref 200–950)
Basophils Absolute: 80 {cells}/uL (ref 0–200)
Basophils Relative: 1.1 %
Eosinophils Absolute: 241 {cells}/uL (ref 15–500)
Eosinophils Relative: 3.3 %
HCT: 49.1 % (ref 38.5–50.0)
Hemoglobin: 16.6 g/dL (ref 13.2–17.1)
MCH: 29.5 pg (ref 27.0–33.0)
MCHC: 33.8 g/dL (ref 32.0–36.0)
MCV: 87.4 fL (ref 80.0–100.0)
MPV: 9.5 fL (ref 7.5–12.5)
Monocytes Relative: 8.9 %
Neutro Abs: 4081 {cells}/uL (ref 1500–7800)
Neutrophils Relative %: 55.9 %
Platelets: 285 10*3/uL (ref 140–400)
RBC: 5.62 10*6/uL (ref 4.20–5.80)
RDW: 12.4 % (ref 11.0–15.0)
Total Lymphocyte: 30.8 %
WBC: 7.3 10*3/uL (ref 3.8–10.8)

## 2023-05-31 LAB — URINALYSIS, ROUTINE W REFLEX MICROSCOPIC
Bilirubin Urine: NEGATIVE
Glucose, UA: NEGATIVE
Hgb urine dipstick: NEGATIVE
Ketones, ur: NEGATIVE
Leukocytes,Ua: NEGATIVE
Nitrite: NEGATIVE
Protein, ur: NEGATIVE
Specific Gravity, Urine: 1.022 (ref 1.001–1.035)
pH: 8 (ref 5.0–8.0)

## 2023-05-31 LAB — COMPLETE METABOLIC PANEL WITH GFR
AG Ratio: 1.8 (calc) (ref 1.0–2.5)
ALT: 10 U/L (ref 9–46)
AST: 15 U/L (ref 10–35)
Albumin: 4.6 g/dL (ref 3.6–5.1)
Alkaline phosphatase (APISO): 90 U/L (ref 35–144)
BUN: 15 mg/dL (ref 7–25)
CO2: 30 mmol/L (ref 20–32)
Calcium: 10.1 mg/dL (ref 8.6–10.3)
Chloride: 105 mmol/L (ref 98–110)
Creat: 1 mg/dL (ref 0.70–1.28)
Globulin: 2.5 g/dL (ref 1.9–3.7)
Glucose, Bld: 80 mg/dL (ref 65–99)
Potassium: 4.6 mmol/L (ref 3.5–5.3)
Sodium: 141 mmol/L (ref 135–146)
Total Bilirubin: 0.6 mg/dL (ref 0.2–1.2)
Total Protein: 7.1 g/dL (ref 6.1–8.1)
eGFR: 81 mL/min/{1.73_m2} (ref >=60)

## 2023-05-31 LAB — MICROALBUMIN / CREATININE URINE RATIO
Creatinine, Urine: 138 mg/dL (ref 20–320)
Microalb Creat Ratio: 4 mg/g{creat} (ref <30)
Microalb, Ur: 0.5 mg/dL

## 2023-05-31 LAB — HEMOGLOBIN A1C
Hgb A1c MFr Bld: 5.1 %{Hb} (ref <5.7)
Mean Plasma Glucose: 100 mg/dL
eAG (mmol/L): 5.5 mmol/L

## 2023-05-31 LAB — LIPID PANEL
Cholesterol: 168 mg/dL (ref <200)
HDL: 32 mg/dL — ABNORMAL LOW (ref >=40)
LDL Cholesterol (Calc): 109 mg/dL — ABNORMAL HIGH
Non-HDL Cholesterol (Calc): 136 mg/dL — ABNORMAL HIGH (ref <130)
Total CHOL/HDL Ratio: 5.3 (calc) — ABNORMAL HIGH (ref <5.0)
Triglycerides: 157 mg/dL — ABNORMAL HIGH (ref <150)

## 2023-05-31 LAB — TSH: TSH: 0.69 m[IU]/L (ref 0.40–4.50)

## 2023-05-31 LAB — INSULIN, RANDOM: Insulin: 21 u[IU]/mL — ABNORMAL HIGH

## 2023-05-31 LAB — VITAMIN D 25 HYDROXY (VIT D DEFICIENCY, FRACTURES): Vit D, 25-Hydroxy: 71 ng/mL (ref 30–100)

## 2023-05-31 LAB — PSA: PSA: 0.64 ng/mL (ref <=4.00)

## 2023-05-31 LAB — MAGNESIUM: Magnesium: 2.6 mg/dL — ABNORMAL HIGH (ref 1.5–2.5)

## 2023-05-31 NOTE — Progress Notes (Signed)
[][][][][][][][][][][][][][][][][][][][][][][][][][][][][][][][][][][][][][][][][]][][][][][][][][][][][][][][][][][][][][][][][[][][][][] [][][][][][][][][][][][][][][][][][][][][][][][][][][][][][][][][][][][][][][][][]][][][][][][][][][][][][][][][][][][][][][][][[][][][][] -  Test results slightly outside the reference range are not unusual. If there is anything important, I will review this with you,  otherwise it is considered normal test values.  If you have further questions,  please do not hesitate to contact me at the office or via My Chart.  [] [] [] [] [] [] [] [] [] [] [] [] [] [] [] [] [] [] [] [] [] [] [] [] [] [] [] [] [] [] [] [] [] [] [] [] [] [] [] [] [] ][] [] [] [] [] [] [] [] [] [] [] [] [] [] [] [] [] [] [] [] [] [] [[] [] [] [] []  [] [] [] [] [] [] [] [] [] [] [] [] [] [] [] [] [] [] [] [] [] [] [] [] [] [] [] [] [] [] [] [] [] [] [] [] [] [] [] [] [] ][] [] [] [] [] [] [] [] [] [] [] [] [] [] [] [] [] [] [] [] [] [] [[] [] [] [] []   - Chol = 168  is at goal  ( less than 180 )  - Great   - But the Bad LDL Chol = 109 is too High  ( Ideal or goal is less than 70  ! )   - Recommend a Stricter low cholesterol diet   - Cholesterol only comes from animal sources                                                                      ie. meat, dairy, egg yolks  - Eat all the vegetables you want.  - Avoid Meat, Avoid Meat,  Avoid Meat                                                 - especially Red Meat - Beef AND Pork .  - Avoid cheese & dairy - milk & ice cream.     - Cheese is the most concentrated form of trans-fats which                                                   is the worst thing to clog up our arteries.    - Veggie cheese is OK which can be found in the fresh                   produce section at [employer] or [employer] or [employer]  [] [] [] [] [] [] [] [] [] [] [] [] [] [] [] [] [] [] [] [] [] [] [] [] [] [] [] [] [] [] [] [] [] [] [] [] [] [] [] [] [] ][] [] [] [] [] [] [] [] [] [] [] [] [] [] [] [] [] [] [] [] [] [] [[] [] [] [] []   PSA is very Low - No Prostate Cancer -   Great !     [] [] [] [] [] [] [] [] [] [] [] [] [] [] [] [] [] [] [] [] [] [] [] [] [] [] [] [] [] [] [] [] [] [] [] [] [] [] [] [] [] ][] [] [] [] [] [] [] [] [] [] [] [] [] [] [] [] [] [] [] [] [] [] [[] [] [] [] []   - A1c - Normal - No Diabetes  -  Great !   [] [] [] [] [] [] [] [] [] [] [] [] [] [] [] [] [] [] [] [] [] [] [] [] [] [] [] [] [] [] [] [] [] [] [] [] [] [] [] [] [] ][] [] [] [] [] [] [] [] [] [] [] [] [] [] [] [] [] [] [] [] [] [] [[] [] [] [] []   - Vitamin D = 71  -  Excellent  - Please keep dosage same   [] [] [] [] [] [] [] [] [] [] [] [] [] [] [] [] [] [] [] [] [] [] [] [] [] [] [] [] [] [] [] [] [] [] [] [] [] [] [] [] [] ][] [] [] [] [] [] [] [] [] [] [] [] [] [] [] [] [] [] [] [] [] [] [[] [] [] [] []   - All Else - CBC - Kidneys - Electrolytes - Liver - Magnesium & Thyroid    - all  Normal / OK  [] [] [] [] [] [] [] [] [] [] [] [] [] [] [] [] [] [] [] [] [] [] [] [] [] [] [] [] [] [] [] [] [] [] [] [] [] [] [] [] [] ][] [] [] [] [] [] [] [] [] [] [] [] [] [] [] [] [] [] [] [] [] [] [[] [] [] [] []

## 2023-08-31 ENCOUNTER — Ambulatory Visit: Payer: PPO | Admitting: Nurse Practitioner

## 2023-12-05 ENCOUNTER — Ambulatory Visit: Payer: PPO | Admitting: Internal Medicine

## 2024-03-07 ENCOUNTER — Ambulatory Visit: Payer: PPO | Admitting: Nurse Practitioner

## 2024-06-24 ENCOUNTER — Encounter: Payer: PPO | Admitting: Internal Medicine
